# Patient Record
Sex: Male | Born: 1995
Health system: Southern US, Community
[De-identification: ages and names within clinical notes are randomized; demographics above are authoritative.]

## PROBLEM LIST (undated history)

## (undated) DIAGNOSIS — I313 Pericardial effusion (noninflammatory): Secondary | ICD-10-CM

## (undated) DIAGNOSIS — D649 Anemia, unspecified: Secondary | ICD-10-CM

## (undated) DIAGNOSIS — I3139 Other pericardial effusion (noninflammatory): Secondary | ICD-10-CM

## (undated) DIAGNOSIS — I309 Acute pericarditis, unspecified: Secondary | ICD-10-CM

---
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
## 2015-06-07 ENCOUNTER — Encounter: Payer: Self-pay | Admitting: Nurse Practitioner

## 2015-06-07 ENCOUNTER — Ambulatory Visit (INDEPENDENT_AMBULATORY_CARE_PROVIDER_SITE_OTHER): Payer: 59 | Admitting: Nurse Practitioner

## 2015-06-07 ENCOUNTER — Other Ambulatory Visit (HOSPITAL_COMMUNITY)
Admission: RE | Admit: 2015-06-07 | Discharge: 2015-06-07 | Disposition: A | Discharge status: Home / Self Care | Payer: 59 | Source: Ambulatory Visit | Attending: Nurse Practitioner | Admitting: Nurse Practitioner

## 2015-06-07 VITALS — BP 120/70 | Temp 98.7°F | Ht 69.0 in | Wt 189.0 lb

## 2015-06-07 DIAGNOSIS — R8290 Unspecified abnormal findings in urine: Secondary | ICD-10-CM | POA: Diagnosis not present

## 2015-06-07 DIAGNOSIS — J069 Acute upper respiratory infection, unspecified: Secondary | ICD-10-CM

## 2015-06-07 DIAGNOSIS — R3989 Other symptoms and signs involving the genitourinary system: Secondary | ICD-10-CM | POA: Diagnosis not present

## 2015-06-07 DIAGNOSIS — B9689 Other specified bacterial agents as the cause of diseases classified elsewhere: Secondary | ICD-10-CM

## 2015-06-07 LAB — HEPATIC FUNCTION PANEL
ALK PHOS: 67 U/L (ref 38–126)
ALT: 23 U/L (ref 17–63)
AST: 14 U/L — AB (ref 15–41)
Albumin: 4 g/dL (ref 3.5–5.0)
BILIRUBIN DIRECT: 0.2 mg/dL (ref 0.1–0.5)
BILIRUBIN INDIRECT: 0.7 mg/dL (ref 0.3–0.9)
TOTAL PROTEIN: 7.3 g/dL (ref 6.5–8.1)
Total Bilirubin: 0.9 mg/dL (ref 0.3–1.2)

## 2015-06-07 LAB — BASIC METABOLIC PANEL
Anion gap: 8 (ref 5–15)
BUN: 17 mg/dL (ref 6–20)
CHLORIDE: 100 mmol/L — AB (ref 101–111)
CO2: 27 mmol/L (ref 22–32)
CREATININE: 0.84 mg/dL (ref 0.61–1.24)
Calcium: 8.7 mg/dL — ABNORMAL LOW (ref 8.9–10.3)
Glucose, Bld: 115 mg/dL — ABNORMAL HIGH (ref 65–99)
POTASSIUM: 3.9 mmol/L (ref 3.5–5.1)
SODIUM: 135 mmol/L (ref 135–145)

## 2015-06-07 LAB — POCT URINALYSIS DIPSTICK
Spec Grav, UA: 1.02
pH, UA: 6

## 2015-06-07 LAB — CBC WITH DIFFERENTIAL/PLATELET
BASOS ABS: 0 10*3/uL (ref 0.0–0.1)
Basophils Relative: 0 %
EOS ABS: 0 10*3/uL (ref 0.0–0.7)
EOS PCT: 0 %
HCT: 36.5 % — ABNORMAL LOW (ref 39.0–52.0)
HEMOGLOBIN: 12.5 g/dL — AB (ref 13.0–17.0)
LYMPHS ABS: 1.4 10*3/uL (ref 0.7–4.0)
LYMPHS PCT: 13 %
MCH: 29.6 pg (ref 26.0–34.0)
MCHC: 34.2 g/dL (ref 30.0–36.0)
MCV: 86.5 fL (ref 78.0–100.0)
Monocytes Absolute: 1.3 10*3/uL — ABNORMAL HIGH (ref 0.1–1.0)
Monocytes Relative: 12 %
NEUTROS PCT: 75 %
Neutro Abs: 8.1 10*3/uL — ABNORMAL HIGH (ref 1.7–7.7)
PLATELETS: 213 10*3/uL (ref 150–400)
RBC: 4.22 MIL/uL (ref 4.22–5.81)
RDW: 12.6 % (ref 11.5–15.5)
WBC: 10.8 10*3/uL — AB (ref 4.0–10.5)

## 2015-06-07 MED ORDER — AZITHROMYCIN 250 MG PO TABS: ORAL_TABLET | ORAL | Status: DC

## 2015-06-07 MED FILL — AZITHROMYCIN 250 MG TABLET: 250 | 5 days supply | Qty: 6 | Fill #0

## 2015-06-07 NOTE — Patient Instructions (Signed)
Anti histamine nasacort AQ or Flonase 

## 2015-06-10 ENCOUNTER — Encounter: Payer: Self-pay | Admitting: Nurse Practitioner

## 2015-06-10 NOTE — Progress Notes (Signed)
Subjective:  Presents for complaints of chest tightness headache head congestion that began 3 days ago. Slight chest pain and tightness with deep breath. No shortness of breath, just difficulty taking a very deep breath. No actual wheezing. No fever. Sore throat has improved. Facial area headache. Minimal cough. No nausea vomiting. No diarrhea or abdominal pain. Does not use an inhaler. No dysuria urgency or frequency. Has noticed some slightly dark urine but states his fluid intake is good. Generalized muscle aches and mild fatigue.  Objective:   BP 120/70 mmHg  Temp(Src) 98.7 F (37.1 C) (Oral)  Ht 5\' 9"  (1.753 m)  Wt 189 lb (85.73 kg)  BMI 27.90 kg/m2 NAD. Alert, oriented. TMs clear effusion, no erythema. Pharynx injected with PND noted. Neck supple with mild soft anterior adenopathy. Lungs clear. No wheezing or tachypnea. Normal color. Normal airflow. Heart regular rate rhythm. Abdomen soft nondistended nontender without obvious hepatomegaly. Urine dipstick specific gravity 1.02 pH 6.0 and urobilinogen greater than 8.  Assessment: Bacterial upper respiratory infection - Plan: CANCELED: CBC with Differential/Platelet, CANCELED: Hepatic function panel, CANCELED: Basic metabolic panel  Abnormal urine color - Plan: POCT urinalysis dipstick, CBC with Differential/Platelet, Hepatic function panel, Basic metabolic panel  Abnormal urine findings - Plan: POCT urinalysis dipstick, CBC with Differential/Platelet, Hepatic function panel, Basic metabolic panel   Plan:  Meds ordered this encounter  Medications  . azithromycin (ZITHROMAX Z-PAK) 250 MG tablet    Sig: Take 2 tablets (500 mg) on  Day 1,  followed by 1 tablet (250 mg) once daily on Days 2 through 5.    Dispense:  6 each    Refill:  0    Order Specific Question:  Supervising Provider    Answer:  Riccardo DubinLUKING, WILLIAM S [[id number]]   Lab work based on urine dipstick and patient's complaints of dark urine. Physical exam of abdomen was normal. OTC  meds as directed for congestion and cough. Call back if worsens or persists.

## 2015-06-19 ENCOUNTER — Encounter: Payer: Self-pay | Admitting: Family Medicine

## 2015-06-19 ENCOUNTER — Encounter (HOSPITAL_COMMUNITY)
Admission: AD | Disposition: A | Discharge status: Home / Self Care | Payer: Self-pay | Source: Other Acute Inpatient Hospital | Attending: Internal Medicine

## 2015-06-19 ENCOUNTER — Inpatient Hospital Stay (HOSPITAL_COMMUNITY)
Admission: AD | Admit: 2015-06-19 | Discharge: 2015-06-21 | DRG: 316 | Disposition: A | Discharge status: Home / Self Care | Payer: 59 | Source: Other Acute Inpatient Hospital | Attending: Internal Medicine | Admitting: Internal Medicine

## 2015-06-19 ENCOUNTER — Encounter: Payer: Self-pay | Admitting: Internal Medicine

## 2015-06-19 ENCOUNTER — Ambulatory Visit (HOSPITAL_COMMUNITY)
Admission: RE | Admit: 2015-06-19 | Discharge: 2015-06-19 | Disposition: A | Discharge status: Home / Self Care | Payer: 59 | Source: Ambulatory Visit | Attending: Family Medicine | Admitting: Family Medicine

## 2015-06-19 ENCOUNTER — Ambulatory Visit (INDEPENDENT_AMBULATORY_CARE_PROVIDER_SITE_OTHER): Payer: 59 | Admitting: Internal Medicine

## 2015-06-19 ENCOUNTER — Ambulatory Visit (HOSPITAL_BASED_OUTPATIENT_CLINIC_OR_DEPARTMENT_OTHER)
Admission: RE | Admit: 2015-06-19 | Discharge: 2015-06-19 | Disposition: A | Discharge status: Home / Self Care | Payer: 59 | Source: Ambulatory Visit | Attending: Internal Medicine | Admitting: Internal Medicine

## 2015-06-19 ENCOUNTER — Ambulatory Visit (INDEPENDENT_AMBULATORY_CARE_PROVIDER_SITE_OTHER): Payer: 59 | Admitting: Family Medicine

## 2015-06-19 ENCOUNTER — Encounter (HOSPITAL_COMMUNITY): Payer: Self-pay | Admitting: Cardiology

## 2015-06-19 VITALS — BP 112/74 | Temp 100.2°F | Ht 70.0 in | Wt 183.0 lb

## 2015-06-19 VITALS — BP 118/62 | HR 134 | Ht 70.0 in | Wt 182.0 lb

## 2015-06-19 DIAGNOSIS — I3139 Other pericardial effusion (noninflammatory): Secondary | ICD-10-CM | POA: Diagnosis present

## 2015-06-19 DIAGNOSIS — D649 Anemia, unspecified: Secondary | ICD-10-CM | POA: Diagnosis not present

## 2015-06-19 DIAGNOSIS — R059 Cough, unspecified: Secondary | ICD-10-CM

## 2015-06-19 DIAGNOSIS — I309 Acute pericarditis, unspecified: Principal | ICD-10-CM | POA: Diagnosis present

## 2015-06-19 DIAGNOSIS — I517 Cardiomegaly: Secondary | ICD-10-CM

## 2015-06-19 DIAGNOSIS — B349 Viral infection, unspecified: Secondary | ICD-10-CM | POA: Diagnosis not present

## 2015-06-19 DIAGNOSIS — R05 Cough: Secondary | ICD-10-CM

## 2015-06-19 DIAGNOSIS — I319 Disease of pericardium, unspecified: Secondary | ICD-10-CM

## 2015-06-19 DIAGNOSIS — I509 Heart failure, unspecified: Secondary | ICD-10-CM | POA: Diagnosis not present

## 2015-06-19 DIAGNOSIS — R509 Fever, unspecified: Secondary | ICD-10-CM | POA: Diagnosis not present

## 2015-06-19 DIAGNOSIS — R0602 Shortness of breath: Secondary | ICD-10-CM | POA: Diagnosis not present

## 2015-06-19 DIAGNOSIS — R000 Tachycardia, unspecified: Secondary | ICD-10-CM | POA: Diagnosis present

## 2015-06-19 DIAGNOSIS — E876 Hypokalemia: Secondary | ICD-10-CM

## 2015-06-19 DIAGNOSIS — I313 Pericardial effusion (noninflammatory): Secondary | ICD-10-CM | POA: Diagnosis not present

## 2015-06-19 HISTORY — DX: Anemia, unspecified: D64.9

## 2015-06-19 HISTORY — DX: Acute pericarditis, unspecified: I30.9

## 2015-06-19 HISTORY — PX: CARDIAC CATHETERIZATION: SHX172

## 2015-06-19 HISTORY — DX: Pericardial effusion (noninflammatory): I31.3

## 2015-06-19 HISTORY — DX: Other pericardial effusion (noninflammatory): I31.39

## 2015-06-19 LAB — CBC WITH DIFFERENTIAL/PLATELET
BASOS PCT: 0 %
Basophils Absolute: 0 10*3/uL (ref 0.0–0.1)
EOS PCT: 0 %
Eosinophils Absolute: 0 10*3/uL (ref 0.0–0.7)
HEMATOCRIT: 32 % — AB (ref 39.0–52.0)
Hemoglobin: 10.4 g/dL — ABNORMAL LOW (ref 13.0–17.0)
LYMPHS PCT: 10 %
Lymphs Abs: 1.8 10*3/uL (ref 0.7–4.0)
MCH: 28.1 pg (ref 26.0–34.0)
MCHC: 32.5 g/dL (ref 30.0–36.0)
MCV: 86.5 fL (ref 78.0–100.0)
MONO ABS: 2.1 10*3/uL — AB (ref 0.1–1.0)
MONOS PCT: 12 %
NEUTROS ABS: 14 10*3/uL — AB (ref 1.7–7.7)
Neutrophils Relative %: 78 %
PLATELETS: 321 10*3/uL (ref 150–400)
RBC: 3.7 MIL/uL — ABNORMAL LOW (ref 4.22–5.81)
RDW: 12.6 % (ref 11.5–15.5)
WBC: 17.9 10*3/uL — ABNORMAL HIGH (ref 4.0–10.5)

## 2015-06-19 LAB — COMPREHENSIVE METABOLIC PANEL
ALT: 23 U/L (ref 17–63)
ANION GAP: 13 (ref 5–15)
AST: 13 U/L — ABNORMAL LOW (ref 15–41)
Albumin: 2.9 g/dL — ABNORMAL LOW (ref 3.5–5.0)
Alkaline Phosphatase: 77 U/L (ref 38–126)
BILIRUBIN TOTAL: 2.2 mg/dL — AB (ref 0.3–1.2)
BUN: 15 mg/dL (ref 6–20)
CHLORIDE: 98 mmol/L — AB (ref 101–111)
CO2: 23 mmol/L (ref 22–32)
Calcium: 8.3 mg/dL — ABNORMAL LOW (ref 8.9–10.3)
Creatinine, Ser: 1.02 mg/dL (ref 0.61–1.24)
GFR calc Af Amer: >60 mL/min (ref >60)
Glucose, Bld: 118 mg/dL — ABNORMAL HIGH (ref 65–99)
POTASSIUM: 3.9 mmol/L (ref 3.5–5.1)
Sodium: 134 mmol/L — ABNORMAL LOW (ref 135–145)
TOTAL PROTEIN: 6.3 g/dL — AB (ref 6.5–8.1)

## 2015-06-19 LAB — ECHOCARDIOGRAM COMPLETE
Height: 70 in
Weight: 2912 oz

## 2015-06-19 LAB — PROTIME-INR
INR: 1.61 — AB (ref 0.00–1.49)
PROTHROMBIN TIME: 19.2 s — AB (ref 11.6–15.2)

## 2015-06-19 LAB — TSH: TSH: 2.218 u[IU]/mL (ref 0.350–4.500)

## 2015-06-19 LAB — MRSA PCR SCREENING: MRSA BY PCR: NEGATIVE

## 2015-06-19 SURGERY — PERICARDIOCENTESIS

## 2015-06-19 MED ORDER — LIDOCAINE HCL (PF) 1 % IJ SOLN
INTRAMUSCULAR | Status: DC | PRN
  Administered 2015-06-19: 13 mL

## 2015-06-19 MED ORDER — FENTANYL CITRATE (PF) 100 MCG/2ML IJ SOLN
INTRAMUSCULAR | Status: AC
  Filled 2015-06-19: qty 2

## 2015-06-19 MED ORDER — ONDANSETRON HCL 4 MG/2ML IJ SOLN: 4.0000 mg | Freq: Four times a day (QID) | INTRAMUSCULAR | Status: DC | PRN

## 2015-06-19 MED ORDER — COLCHICINE 0.6 MG PO TABS
0.6000 mg | ORAL_TABLET | Freq: Every day | ORAL | Status: DC
  Administered 2015-06-20 – 2015-06-21 (×3): 0.6 mg via ORAL
  Filled 2015-06-19 (×3): qty 1

## 2015-06-19 MED ORDER — HEPARIN (PORCINE) IN NACL 2-0.9 UNIT/ML-% IJ SOLN
INTRAMUSCULAR | Status: AC
  Filled 2015-06-19: qty 500

## 2015-06-19 MED ORDER — MIDAZOLAM HCL 2 MG/2ML IJ SOLN
INTRAMUSCULAR | Status: AC
  Filled 2015-06-19: qty 2

## 2015-06-19 MED ORDER — FENTANYL CITRATE (PF) 100 MCG/2ML IJ SOLN
INTRAMUSCULAR | Status: DC | PRN
  Administered 2015-06-19: 50 ug via INTRAVENOUS

## 2015-06-19 MED ORDER — ACETAMINOPHEN 325 MG PO TABS
650.0000 mg | ORAL_TABLET | ORAL | Status: DC | PRN
  Administered 2015-06-20: 650 mg via ORAL
  Filled 2015-06-19: qty 2

## 2015-06-19 MED ORDER — ASPIRIN 300 MG RE SUPP: 300.0000 mg | RECTAL | Status: AC

## 2015-06-19 MED ORDER — LIDOCAINE HCL (PF) 1 % IJ SOLN
INTRAMUSCULAR | Status: AC
  Filled 2015-06-19: qty 30

## 2015-06-19 MED ORDER — ONDANSETRON HCL 8 MG PO TABS: 8.0000 mg | ORAL_TABLET | Freq: Three times a day (TID) | ORAL | Status: DC | PRN

## 2015-06-19 MED ORDER — CEFPROZIL 500 MG PO TABS: 500.0000 mg | ORAL_TABLET | Freq: Two times a day (BID) | ORAL | Status: DC

## 2015-06-19 MED ORDER — ASPIRIN 81 MG PO CHEW
324.0000 mg | CHEWABLE_TABLET | ORAL | Status: AC
  Administered 2015-06-20: 324 mg via ORAL
  Filled 2015-06-19: qty 4

## 2015-06-19 MED ORDER — IBUPROFEN 800 MG PO TABS
800.0000 mg | ORAL_TABLET | Freq: Three times a day (TID) | ORAL | Status: DC
  Administered 2015-06-20 – 2015-06-21 (×5): 800 mg via ORAL
  Filled 2015-06-19 (×2): qty 4
  Filled 2015-06-19 (×5): qty 1
  Filled 2015-06-19 (×3): qty 4
  Filled 2015-06-19 (×2): qty 1

## 2015-06-19 MED ORDER — HEPARIN (PORCINE) IN NACL 2-0.9 UNIT/ML-% IJ SOLN
INTRAMUSCULAR | Status: DC | PRN
  Administered 2015-06-19: 500 mL

## 2015-06-19 MED ORDER — MIDAZOLAM HCL 2 MG/2ML IJ SOLN
INTRAMUSCULAR | Status: DC | PRN
  Administered 2015-06-19: 2 mg via INTRAVENOUS

## 2015-06-19 SURGICAL SUPPLY — 4 items
PACK CARDIAC CATHETERIZATION (CUSTOM PROCEDURE TRAY) ×2 IMPLANT
PERIVAC PERICARDIOCENTESIS 8.3 (TRAY / TRAY PROCEDURE) ×2 IMPLANT
PROTECTION STATION PRESSURIZED (MISCELLANEOUS) ×3
STATION PROTECTION PRESSURIZED (MISCELLANEOUS) IMPLANT

## 2015-06-19 NOTE — Progress Notes (Addendum)
Called to see patient after arrival [facility]  Patient has a 3 week history of viral syndrome with fever, headache, N/V and abdominal pain and was treated with antibiotics and finished 8 days ago and got better and then developed RUQ pain with chest pressure on Thursday.  Sat and Sunday had N/V all day and chest tightness.  Seen by PCP and was febrile to 100.2 and was tachycardic at 112bpm.  Sent over to Cardiology.  When seen by Dr. Tenny Crawoss his HR was 130's.  Echo done which showed large pericardial effusion with 20-3925mmHg change in MV inflow velocity with respiration, dilated IVC at 2.6 cm with < 50% change in diameter with respirations.  He continues to have CP and SOB at rest.  HR now 115bpm with SBP mid 90's despite IVF bolus.  I have discussed the case with Dr. Swazil[place]Jordan who is on call for interventional Cardiology.  Recommend proceeding with pericardiocentesis tonight with pericardial drain.  Risks of procedure explained to the patient and family including risk of RV tear with need for emergent cardiac surgery, infection, bleeding and death.  Patient and mother understand risks and agree to proceed.  Will hold off on NSAIDs until after pericardial tap.  Start colchicine 0.6mg  BID.  I have spent 40 minutes with patient reviewing echo, labs, admission note, interviewing patient and discussing assessment and plan as well as risks and benefits of pericardiocentesis.  > 50% of time was spent in direct patient care.

## 2015-06-19 NOTE — Patient Instructions (Signed)
Your physician has requested that you have an echocardiogram NOW. Echocardiography is a painless test that uses sound waves to create images of your heart. It provides your doctor with information about the size and shape of your heart and how well your heart's chambers and valves are working. This procedure takes approximately one hour. There are no restrictions for this procedure.  Dr Tenny Crawoss to discuss with parent discharge instructions

## 2015-06-19 NOTE — Progress Notes (Signed)
   Cardiology Office Note   Date:  06/19/2015   ID:  Johnathan Dresseryler L Bristow, DOB 1995/11/20, MRN [id number]  PCP:  Lilyan PuntScott Luking, MD  Cardiologist:   Dietrich PatesPaula Nikia Levels, MD   No chief complaint on file. Pt [facility] from [facility] for SOB, tachycardia   History of Present Illness: Johnathan Ford is a 20 y.o. male with no prior cardiac history   Pt had an uppper respiratory inffection a few wks ago   (fever, HA, abdominal pain, N/V) 3 wks ago  Rx ABx  Finished about 8 days ago   Felt better for a few days then by Thursday developed RUQ discomfort, chest pressure  Sat and Sunday have N/V through day  Still with chest tightness  Has nt been comfortable in bed  Feverish.   Seen by Dr Gerda DissLuking this AM  T 100.2  BP 112/  Pulse elevated   Sinus prssure  Got better after ABX done but thhen started having SOB, low greade fever, aches  No cough Inte Pt does do cross fit.  Takes supplements (diet   No steroids         Outpatient Prescriptions Prior to Visit  Medication Sig Dispense Refill  . cefPROZIL (CEFZIL) 500 MG tablet Take 1 tablet (500 mg total) by mouth 2 (two) times daily. 20 tablet 0  . ondansetron (ZOFRAN) 8 MG tablet Take 1 tablet (8 mg total) by mouth every 8 (eight) hours as needed for nausea. 20 tablet 0   No facility-administered medications prior to visit.     Allergies:   Review of patient's allergies indicates no known allergies.   No past medical history on file.  No past surgical history on file.   Social History:  The patient  reports that he has never smoked. He does not have any smokeless tobacco history on file. He reports that he does not drink alcohol or use illicit drugs.   Family History:  The patient's family history is not on file.    ROS:  Please see the history of present illness. All other systems are reviewed and  Negative to the above problem except as noted.    PHYSICAL EXAM: VS:  There were no vitals taken for this visit.  GEN: Well nourished, well  developed, in no acute distress HEENT: normal Neck: no JVD, carotid bruits, or masses Cardiac: RRR; no murmurs, rubs, or gallops,no edema  Respiratory:  clear to auscultation bilaterally, normal work of breathing GI: soft, nontender, nondistended, + BS  No hepatomegaly  MS: no deformity Moving all extremities   Skin: warm and dry, no rash Neuro:  Strength and sensation are intact Psych: euthymic mood, full affect   EKG:  EKG is ordered today.   Lipid Panel No results found for: CHOL, TRIG, HDL, CHOLHDL, VLDL, LDLCALC, LDLDIRECT    Wt Readings from Last 3 Encounters:  06/19/15 183 lb (83.008 kg) (84 %*, Z = 0.98)  06/07/15 189 lb (85.73 kg) (88 %*, Z = 1.15)  03/17/13 163 lb 2 oz (73.993 kg) (76 %*, Z = 0.70)   * Growth percentiles are based on CDC 2-20 Years data.      ASSESSMENT AND PLAN:   See H and P in note section  Pt admitted to CCU  Signed, Dietrich PatesPaula Lacy Taglieri, MD  06/19/2015 3:19 PM    [facility] [address], [place], [place]  [address] Phone: [phone]; Fax: [phone]

## 2015-06-19 NOTE — H&P (View-Only) (Signed)
Called to see patient after arrival in [facility] as transfer from [facility] office.  Patient has a 3 week history of viral syndrome with fever, headache, N/V and abdominal pain and was treated with antibiotics and finished 8 days ago and got better and then developed RUQ pain with chest pressure on Thursday.  Sat and Sunday had N/V all day and chest tightness.  Seen by PCP and was febrile to 100.2 and was tachycardic at 112bpm.  Sent over to Cardiology.  When seen by Dr. Ross his HR was 130's.  Echo done which showed large pericardial effusion with 20-25mmHg change in MV inflow velocity with respiration, dilated IVC at 2.6 cm with < 50% change in diameter with respirations.  He continues to have CP and SOB at rest.  HR now 115bpm with SBP mid 90's despite IVF bolus.  I have discussed the case with Dr. Jordan who is on call for interventional Cardiology.  Recommend proceeding with pericardiocentesis tonight with pericardial drain.  Risks of procedure explained to the patient and family including risk of RV tear with need for emergent cardiac surgery, infection, bleeding and death.  Patient and mother understand risks and agree to proceed.  Will hold off on NSAIDs until after pericardial tap.  Start colchicine 0.6mg BID.  I have spent 40 minutes with patient reviewing echo, labs, admission note, interviewing patient and discussing assessment and plan as well as risks and benefits of pericardiocentesis.  > 50% of time was spent in direct patient care. 

## 2015-06-19 NOTE — H&P (Addendum)
Primary Physician: Lilyan Punt  Primary Cardiologist:  Haynes Kerns  Pt presents for SOB and CP    HPI: Johnathan Ford is a 20 y.o. male with no prior cardiac history  Pt had an uppper respiratory inffection a few wks ago (fever, HA, abdominal pain, N/V) 3 wks ago Rx ABx Finished about 8 days ago Felt better for a few days then by Thursday developed RUQ discomfort, chest pressure Sat and Sunday have N/V through day Still with chest tightness Has nt been comfortable in bed Feverish.  Seen by Dr Gerda Diss this AM T 100.2 BP 112/ Pulse elevated  Sinus prssure Got better after ABX done but thhen started having SOB, low greade fever, aches No cough In clinic T 102  HR 120  BP 100   Pt does do cross fit. Takes supplements (diet protein,  No steroids) Was well until a few wks ago           No past medical history on file.  No prescriptions prior to admission       Infusions:   No Known Allergies  Social History   Social History  . Marital Status: Single    Spouse Name: N/A  . Number of Children: N/A  . Years of Education: N/A   Occupational History  . Not on file.   Social History Main Topics  . Smoking status: Never Smoker   . Smokeless tobacco: Not on file  . Alcohol Use: No  . Drug Use: No  . Sexual Activity: No   Other Topics Concern  . Not on file   Social History Narrative    No family history on file.  No family history of premature CAD or CHF    REVIEW OF SYSTEMS:  All systems reviewed  Negative to the above problem except as noted above.    PHYSICAL EXAM: There were no vitals filed for this visit.  No intake or output data in the 24 hours ending 06/19/15 1739  General:  Well appearing. No respiratory difficulty HEENT: normal Neck: supple. no JVD. Carotids 2+ bilat; no bruits. No lymphadenopathy or thryomegaly appreciated. Cor: PMI nondisplaced. Regular rate & rhythm. Tachy  No rubs, gallops or murmurs. Lungs:  clear Abdomen: soft, LIver edge is 2 cm below rib linie  Tender   No bruits or masses. Good bowel sounds. Extremities: no cyanosis, clubbing, rash, edema Neuro: alert & oriented x 3, cranial nerves grossly intact. moves all 4 extremities w/o difficulty. Affect pleasant.  ECG:  From Dr Cathlyn Parsons office  ST 139 bpm  T wave inversion infeirorlaterally  No results found for this or any previous visit (from the past 24 hour(s)). Dg Chest 2 View  06/19/2015  CLINICAL DATA:  Cough, fever EXAM: CHEST  2 VIEW COMPARISON:  04/18/2006 FINDINGS: There is significant cardiomegaly new from prior exam. There is trace left pleural effusion or scarring in left costophrenic angle. Mild left basilar atelectasis. No infiltrate or pulmonary edema. Bony thorax is unremarkable. IMPRESSION: Significant cardiomegaly new from prior exam. Trace left pleural effusion or scarring in left costophrenic angle. Mild left basilar atelectasis. No segmental infiltrate or pulmonary edema. Electronically Signed   By: Natasha Mead M.D.   On: 06/19/2015 12:27     ASSESSMENT:  Pt is a 20 yo with no prior cardiac history  3 wk hx consistent with URI  Viral infection Presetns today with continued SOB and chest discomfort.  Tachycardic on exam  CXR with cardiomegaly  Echo  shows large pericardial effusion around heart  There is RV indentation in diastole  There is some mitral inflow variation but does not appear greater than 25%; IVC is dilated with blunted inspir collapse  LV is normal in size  Difficult to accurately assess LVEF given tachycardia  May be mildly depressed  On exam   T 102  P 120   BP 100/  There were no pulsus paradoxus  PT with probable viral pericarditis +- myocarditis. Hemodynamics are tenuous now.   Given 500 cc IV in clinic   Urine concentrated  Plan to tx to [facility] with admission to CCU  Dr harding notified of transfer.  Will need pericardial drainage    :

## 2015-06-19 NOTE — Addendum Note (Signed)
Addended by: Metro KungICHARDS, Dameir Gentzler M on: 06/19/2015 02:28 PM   Modules accepted: Orders

## 2015-06-19 NOTE — Interval H&P Note (Signed)
History and Physical Interval Note:  06/19/2015 10:54 PM  Johnathan Ford  has presented today for surgery, with the diagnosis of pericardial tap  The various methods of treatment have been discussed with the patient and family. After consideration of risks, benefits and other options for treatment, the patient has consented to  Procedure(s): Pericardiocentesis (N/A) as a surgical intervention .  The patient's history has been reviewed, patient examined, no change in status, stable for surgery.  I have reviewed the patient's chart and labs.  Questions were answered to the patient's satisfaction.     Myka Hitz [place]az[place]Jordan MD,FACC 06/19/2015 10:54 PM

## 2015-06-19 NOTE — Progress Notes (Addendum)
   Subjective:    Patient ID: Johnathan Ford, male    DOB: 06-25-1995, 20 y.o.   MRN: [id number]  Sinusitis This is a new problem. Episode onset: 3 weeks ago. (Abd pain, fever, headache, runny nose, cough, vomiting) Treatments tried: antibotics.   Patient states he started off couple weeks go ahead congestion drainage coughing not feeling good but then it progressed into having some sinus pressure is well in addition to this he describes intermittent abdominal pain and discomfort in the upper right quadrant into the lower part of his chest this Johnathan Ford better after he took antibiotics but then it started coming back over the weekend he started noticing low-grade fever fatigue body aches with it as well denies coughing up any discolored phlegm denies any lack of appetite but does relate some intermittent nausea vomiting. Patient states that he felt intermittent tightness in the chest. Also right upper quadrant discomfort. Patient states he had similar symptoms approximately 3 weeks ago got better after the antibiotics but then over this weekend got dramatically worse. No prior troubles. Patient does do cross fit-she does take some dietary supplements but denies using any type of steroids or any type of tablets he just basically does protein powders.  Review of Systems    see above. Low-grade fevers this weekend. Objective:   Physical Exam  On examination lungs are clear but diminished breath sounds on the right side he is having tenderness in the right upper quadrant ears are normal throat is normal neck no masses. Should be noted that patient is tachycardic. Extremities no edema. We need to do the chest x-ray to rule out the possibility of pneumonia. Hold off on lab work and IV medicines currently    Assessment & Plan:  Viral syndrome versus secondary bacterial infection Cefzil twice a day also stat chest x-ray lab work was completed on last visit do not feel further lab work necessary. If if  chest x-ray is nonspecific may have to do an ultrasound depending on how well the patient improves over the next couple days  Please note chest x-ray showed significant cardiomegaly. This was noted on radiology report and also viewed by myself with agreement. The patient was called and was asked to come back in and the EKG was made which shows sinus tachycardia with a T-wave abnormality.  This is concerning for viral myocarditis. I spoke with Dr. Dietrich PatesPaula Ford cardiology. They could see him right away and would be conducting an echo. More than likely will need further workup possible admission. The patient's mother was present with him and she was taking him directly to cardiology at the hospital.

## 2015-06-20 ENCOUNTER — Encounter (HOSPITAL_COMMUNITY): Payer: Self-pay | Admitting: Cardiology

## 2015-06-20 ENCOUNTER — Encounter (HOSPITAL_COMMUNITY)
Admission: AD | Disposition: A | Discharge status: Home / Self Care | Payer: Self-pay | Source: Other Acute Inpatient Hospital | Attending: Internal Medicine

## 2015-06-20 ENCOUNTER — Inpatient Hospital Stay (HOSPITAL_COMMUNITY): Payer: 59

## 2015-06-20 ENCOUNTER — Ambulatory Visit (HOSPITAL_COMMUNITY): Admission: RE | Admit: 2015-06-20 | Payer: 59 | Source: Ambulatory Visit | Admitting: Interventional Cardiology

## 2015-06-20 DIAGNOSIS — I319 Disease of pericardium, unspecified: Secondary | ICD-10-CM

## 2015-06-20 LAB — CBC
HEMATOCRIT: 30.1 % — AB (ref 39.0–52.0)
HEMOGLOBIN: 9.9 g/dL — AB (ref 13.0–17.0)
MCH: 28.4 pg (ref 26.0–34.0)
MCHC: 32.9 g/dL (ref 30.0–36.0)
MCV: 86.2 fL (ref 78.0–100.0)
Platelets: 307 10*3/uL (ref 150–400)
RBC: 3.49 MIL/uL — ABNORMAL LOW (ref 4.22–5.81)
RDW: 12.7 % (ref 11.5–15.5)
WBC: 12.5 10*3/uL — ABNORMAL HIGH (ref 4.0–10.5)

## 2015-06-20 LAB — BODY FLUID CELL COUNT WITH DIFFERENTIAL
Eos, Fluid: 0 %
Lymphs, Fluid: 59 %
MONOCYTE-MACROPHAGE-SEROUS FLUID: 11 % — AB (ref 50–90)
Neutrophil Count, Fluid: 30 % — ABNORMAL HIGH (ref 0–25)
Total Nucleated Cell Count, Fluid: 380 cu mm (ref 0–1000)

## 2015-06-20 LAB — ECHOCARDIOGRAM LIMITED
HEIGHTINCHES: 70 in
Weight: 2910.07 oz

## 2015-06-20 LAB — BASIC METABOLIC PANEL
ANION GAP: 11 (ref 5–15)
BUN: 15 mg/dL (ref 6–20)
CALCIUM: 8 mg/dL — AB (ref 8.9–10.3)
CO2: 25 mmol/L (ref 22–32)
Chloride: 98 mmol/L — ABNORMAL LOW (ref 101–111)
Creatinine, Ser: 0.92 mg/dL (ref 0.61–1.24)
GLUCOSE: 116 mg/dL — AB (ref 65–99)
POTASSIUM: 3 mmol/L — AB (ref 3.5–5.1)
Sodium: 134 mmol/L — ABNORMAL LOW (ref 135–145)

## 2015-06-20 LAB — PROTEIN, PERICARDIAL FLUID: Protein, Pericardial Fluid: 6 g/dL

## 2015-06-20 LAB — CREATININE, FLUID (PLEURAL, PERITONEAL, JP DRAINAGE): CREAT FL: 0.9 mg/dL

## 2015-06-20 LAB — LACTATE DEHYDROGENASE, PLEURAL OR PERITONEAL FLUID: LD FL: 424 U/L — AB (ref 3–23)

## 2015-06-20 LAB — GRAM STAIN

## 2015-06-20 SURGERY — PERICARDIOCENTESIS

## 2015-06-20 MED ORDER — MUPIROCIN 2 % EX OINT
TOPICAL_OINTMENT | CUTANEOUS | Status: AC
  Filled 2015-06-20: qty 22

## 2015-06-20 MED ORDER — IOPAMIDOL (ISOVUE-300) INJECTION 61%
INTRAVENOUS | Status: AC
  Administered 2015-06-20: 75 mL
  Filled 2015-06-20: qty 75

## 2015-06-20 MED ORDER — POTASSIUM CHLORIDE CRYS ER 20 MEQ PO TBCR
40.0000 meq | EXTENDED_RELEASE_TABLET | Freq: Once | ORAL | Status: AC
  Administered 2015-06-20: 40 meq via ORAL
  Filled 2015-06-20: qty 2

## 2015-06-20 NOTE — Progress Notes (Addendum)
Subjective: Breathing is good  No CP   Objective: Filed Vitals:   06/20/15 0400 06/20/15 0430 06/20/15 0500 06/20/15 0600  BP: 89/47 92/45 101/39 104/52  Pulse: 81 76 76 84  Temp:  97.9 F (36.6 C)    TempSrc:  Oral    Resp: Height:      Weight:      SpO2: 97% 97% 100% 97%   Weight change:   Intake/Output Summary (Last 24 hours) at 06/20/15 0754 Last data filed at 06/20/15 0400  Gross per 24 hour  Intake    980 ml  Output   1050 ml  Net    -70 ml    General: Alert, awake, oriented x3, in no acute distress Neck:  JVP is normal No lyympadenopathy  Heart: Regular rate and rhythm, without murmurs, rubs, gallops.  Lungs: Clear to auscultation.  No rales or wheezes. Abdomen  Supple  No masses  nontender  No hepatosplenomegaly  Exemities:  No edema.   Neuro: Grossly intact, nonfocal.  Tele  SR  90s  Lab Results: Results for orders placed or performed during the hospital encounter of 06/19/15 (from the past 24 hour(s))  MRSA PCR Screening     Status: None   Collection Time: 06/19/15  6:44 PM  Result Value Ref Range   MRSA by PCR NEGATIVE NEGATIVE  TSH     Status: None   Collection Time: 06/19/15  7:37 PM  Result Value Ref Range   TSH 2.218 0.350 - 4.500 uIU/mL  CBC WITH DIFFERENTIAL     Status: Abnormal   Collection Time: 06/19/15  8:15 PM  Result Value Ref Range   WBC 17.9 (H) 4.0 - 10.5 K/uL   RBC 3.70 (L) 4.22 - 5.81 MIL/uL   Hemoglobin 10.4 (L) 13.0 - 17.0 g/dL   HCT 16.1 (L) 09.6 - 04.5 %   MCV 86.5 78.0 - 100.0 fL   MCH 28.1 26.0 - 34.0 pg   MCHC 32.5 30.0 - 36.0 g/dL   RDW 40.9 81.1 - 91.4 %   Platelets 321 150 - 400 K/uL   Neutrophils Relative % 78 %   Neutro Abs 14.0 (H) 1.7 - 7.7 K/uL   Lymphocytes Relative 10 %   Lymphs Abs 1.8 0.7 - 4.0 K/uL   Monocytes Relative 12 %   Monocytes Absolute 2.1 (H) 0.1 - 1.0 K/uL   Eosinophils Relative 0 %   Eosinophils Absolute 0.0 0.0 - 0.7 K/uL   Basophils Relative 0 %   Basophils Absolute 0.0 0.0  - 0.1 K/uL  Comprehensive metabolic panel     Status: Abnormal   Collection Time: 06/19/15  8:15 PM  Result Value Ref Range   Sodium 134 (L) 135 - 145 mmol/L   Potassium 3.9 3.5 - 5.1 mmol/L   Chloride 98 (L) 101 - 111 mmol/L   CO2 23 22 - 32 mmol/L   Glucose, Bld 118 (H) 65 - 99 mg/dL   BUN 15 6 - 20 mg/dL   Creatinine, Ser 7.82 0.61 - 1.24 mg/dL   Calcium 8.3 (L) 8.9 - 10.3 mg/dL   Total Protein 6.3 (L) 6.5 - 8.1 g/dL   Albumin 2.9 (L) 3.5 - 5.0 g/dL   AST 13 (L) 15 - 41 U/L   ALT 23 17 - 63 U/L   Alkaline Phosphatase 77 38 - 126 U/L   Total Bilirubin 2.2 (H) 0.3 - 1.2 mg/dL   GFR calc non Af Amer >60 >60  mL/min   GFR calc Af Amer >60 >60 mL/min   Anion gap 13 5 - 15  Protime-INR     Status: Abnormal   Collection Time: 06/19/15  8:15 PM  Result Value Ref Range   Prothrombin Time 19.2 (H) 11.6 - 15.2 seconds   INR 1.61 (H) 0.00 - 1.49  Lactate dehydrogenase (CSF, pleural or peritoneal fluid)     Status: Abnormal   Collection Time: 06/19/15 11:11 PM  Result Value Ref Range   LD, Fluid 424 (H) 3 - 23 U/L   Fluid Type-FLDH PERICARDIAL SAC   Culture, body fluid-bottle     Status: None (Preliminary result)   Collection Time: 06/19/15 11:12 PM  Result Value Ref Range   Specimen Description FLUID PERICARDIAL    Special Requests BOTTLES DRAWN AEROBIC AND ANAEROBIC 10CC    Culture PENDING    Report Status PENDING   Gram stain     Status: None   Collection Time: 06/19/15 11:12 PM  Result Value Ref Range   Specimen Description FLUID PERICARDIAL    Special Requests NONE    Gram Stain      RARE WBC PRESENT, PREDOMINANTLY MONONUCLEAR NO ORGANISMS SEEN    Report Status 06/20/2015 FINAL   Basic metabolic panel     Status: Abnormal   Collection Time: 06/20/15  3:04 AM  Result Value Ref Range   Sodium 134 (L) 135 - 145 mmol/L   Potassium 3.0 (L) 3.5 - 5.1 mmol/L   Chloride 98 (L) 101 - 111 mmol/L   CO2 25 22 - 32 mmol/L   Glucose, Bld 116 (H) 65 - 99 mg/dL   BUN 15 6 - 20 mg/dL    Creatinine, Ser 2.950.92 0.61 - 1.24 mg/dL   Calcium 8.0 (L) 8.9 - 10.3 mg/dL   GFR calc non Af Amer >60 >60 mL/min   GFR calc Af Amer >60 >60 mL/min   Anion gap 11 5 - 15  CBC     Status: Abnormal   Collection Time: 06/20/15  3:04 AM  Result Value Ref Range   WBC 12.5 (H) 4.0 - 10.5 K/uL   RBC 3.49 (L) 4.22 - 5.81 MIL/uL   Hemoglobin 9.9 (L) 13.0 - 17.0 g/dL   HCT 62.130.1 (L) 30.839.0 - 65.752.0 %   MCV 86.2 78.0 - 100.0 fL   MCH 28.4 26.0 - 34.0 pg   MCHC 32.9 30.0 - 36.0 g/dL   RDW 84.612.7 96.211.5 - 95.215.5 %   Platelets 307 150 - 400 K/uL    Studies/Results: Dg Chest 2 View  06/19/2015  CLINICAL DATA:  Cough, fever EXAM: CHEST  2 VIEW COMPARISON:  04/18/2006 FINDINGS: There is significant cardiomegaly new from prior exam. There is trace left pleural effusion or scarring in left costophrenic angle. Mild left basilar atelectasis. No infiltrate or pulmonary edema. Bony thorax is unremarkable. IMPRESSION: Significant cardiomegaly new from prior exam. Trace left pleural effusion or scarring in left costophrenic angle. Mild left basilar atelectasis. No segmental infiltrate or pulmonary edema. Electronically Signed   By: Natasha MeadLiviu  Pop M.D.   On: 06/19/2015 12:27    Medications:REviewed  @PROBHOSP @  1  Pericardial effusion  Pt is s/p pericardiocentesis last night  700 cc of serosanguinous fluid with WBCs  (lymphocytes, neutrophils)  Cytology pending   Drain with minimal output   Pt is anemic with continued low grade fevers  (? B symptoms)  ? Malignant   ? Infectious/post infectious  REcomm CT of chest   Await cytology  Would recomm limited echo to reeval effusion and also pumping function  Diffcult to assess LVEF as he was very tachyardic   2  Hypokalemia  K given  WIll follow     LOS: 1 day   Dietrich Pates 06/20/2015, 7:54 AM

## 2015-06-20 NOTE — Progress Notes (Signed)
Echocardiogram 2D Echocardiogram has been performed.  Dorothey BasemanReel, Hanah Moultry M 06/20/2015, 11:33 AM

## 2015-06-20 NOTE — Care Management Note (Signed)
Case Management Note  Patient Details  Name: Johnathan Ford MRN: [id number] Date of Birth: 25-Feb-1996  Subjective/Objective:        Adm w pericardial effusion            Action/Plan:lives at home, pcp d luking   Expected Discharge Date:                  Expected Discharge Plan:  Home/Self Care  In-House Referral:     Discharge planning Services     Post Acute Care Choice:    Choice offered to:     DME Arranged:    DME Agency:     HH Arranged:    HH Agency:     Status of Service:     Medicare Important Message Given:    Date Medicare IM Given:    Medicare IM give by:    Date Additional Medicare IM Given:    Additional Medicare Important Message give by:     If discussed at Long Length of Stay Meetings, dates discussed:    Additional Comments: ur review done  Hanley HaysDowell, Kimyatta Lecy T, RN 06/20/2015, 7:46 AM

## 2015-06-20 NOTE — Progress Notes (Signed)
Cardiology MD paged in regards to patient's blood pressure systolic in 80-90's. MD would like to continue monitoring patient for now.  Milon DikesKristina D Nia Nathaniel, RN

## 2015-06-20 NOTE — Progress Notes (Signed)
Notified Cardiology MD of critical K+ this AM. Will continue to monitor. Milon DikesKristina D Jalina Blowers, RN

## 2015-06-21 ENCOUNTER — Encounter (HOSPITAL_COMMUNITY): Payer: Self-pay | Admitting: Nurse Practitioner

## 2015-06-21 ENCOUNTER — Other Ambulatory Visit: Payer: Self-pay | Admitting: Nurse Practitioner

## 2015-06-21 ENCOUNTER — Inpatient Hospital Stay (HOSPITAL_COMMUNITY): Payer: 59

## 2015-06-21 DIAGNOSIS — D649 Anemia, unspecified: Secondary | ICD-10-CM

## 2015-06-21 DIAGNOSIS — I313 Pericardial effusion (noninflammatory): Secondary | ICD-10-CM

## 2015-06-21 DIAGNOSIS — I3139 Other pericardial effusion (noninflammatory): Secondary | ICD-10-CM

## 2015-06-21 DIAGNOSIS — I319 Disease of pericardium, unspecified: Secondary | ICD-10-CM

## 2015-06-21 DIAGNOSIS — E876 Hypokalemia: Secondary | ICD-10-CM

## 2015-06-21 LAB — BASIC METABOLIC PANEL
ANION GAP: 10 (ref 5–15)
BUN: 14 mg/dL (ref 6–20)
CHLORIDE: 102 mmol/L (ref 101–111)
CO2: 24 mmol/L (ref 22–32)
Calcium: 8.3 mg/dL — ABNORMAL LOW (ref 8.9–10.3)
Creatinine, Ser: 0.87 mg/dL (ref 0.61–1.24)
Glucose, Bld: 87 mg/dL (ref 65–99)
POTASSIUM: 3.8 mmol/L (ref 3.5–5.1)
SODIUM: 136 mmol/L (ref 135–145)

## 2015-06-21 LAB — CBC
HCT: 31.1 % — ABNORMAL LOW (ref 39.0–52.0)
Hemoglobin: 10 g/dL — ABNORMAL LOW (ref 13.0–17.0)
MCH: 27.9 pg (ref 26.0–34.0)
MCHC: 32.2 g/dL (ref 30.0–36.0)
MCV: 86.9 fL (ref 78.0–100.0)
Platelets: 361 10*3/uL (ref 150–400)
RBC: 3.58 MIL/uL — ABNORMAL LOW (ref 4.22–5.81)
RDW: 12.8 % (ref 11.5–15.5)
WBC: 11.2 10*3/uL — ABNORMAL HIGH (ref 4.0–10.5)

## 2015-06-21 LAB — ECHOCARDIOGRAM LIMITED
HEIGHTINCHES: 70 in
WEIGHTICAEL: 2910.07 [oz_av]

## 2015-06-21 LAB — PH, BODY FLUID: pH, Body Fluid: 7.5

## 2015-06-21 MED ORDER — PANTOPRAZOLE SODIUM 40 MG PO TBEC
40.0000 mg | DELAYED_RELEASE_TABLET | Freq: Every day | ORAL | Status: DC
  Administered 2015-06-21: 40 mg via ORAL
  Filled 2015-06-21: qty 1

## 2015-06-21 MED ORDER — COLCHICINE 0.6 MG PO TABS: 0.6000 mg | ORAL_TABLET | Freq: Every day | ORAL | Status: DC

## 2015-06-21 MED ORDER — IBUPROFEN 800 MG PO TABS: 800.0000 mg | ORAL_TABLET | Freq: Three times a day (TID) | ORAL | Status: DC

## 2015-06-21 MED ORDER — PANTOPRAZOLE SODIUM 40 MG PO TBEC: 40.0000 mg | DELAYED_RELEASE_TABLET | Freq: Every day | ORAL | Status: DC

## 2015-06-21 MED FILL — COLCHICINE 0.6 MG TABLET: 0.6 | 90 days supply | Qty: 90 | Fill #0

## 2015-06-21 MED FILL — PANTOPRAZOLE SOD DR 40 MG T: 40 | 30 days supply | Qty: 30 | Fill #0

## 2015-06-21 NOTE — Progress Notes (Signed)
Patient Name: Johnathan Ford Date of Encounter: 06/21/2015  Hospital Problem List     Principal Problem:   Pericardial effusion Active Problems:   Acute pericarditis   Viral syndrome   Normocytic anemia   Hypokalemia   Subjective   Still with some pleuritic chest pain though overall improved.  Pericardial drain d/c'd yesterday.  Limited echo while drain in showed reduced size of effusion.  CT Chest w/o nodules.  Pathology suggestive of reactive process.  Inpatient Medications    . colchicine  0.6 mg Oral Daily  . ibuprofen  800 mg Oral TID    Vital Signs    Filed Vitals:   06/21/15 0300 06/21/15 0400 06/21/15 0521 06/21/15 0600  BP: 102/52 103/55 95/50 99/58   Pulse: 85 75 78 80  Temp:  99.2 F (37.3 C)    TempSrc:  Oral    Resp: 22 18 20 24   Height:      Weight:      SpO2: 94% 97% 95% 96%    Intake/Output Summary (Last 24 hours) at 06/21/15 0850 Last data filed at 06/20/15 1800  Gross per 24 hour  Intake   1260 ml  Output     10 ml  Net   1250 ml   Filed Weights   06/20/15 0000  Weight: 181 lb 14.1 oz (82.5 kg)    Physical Exam    General: Pleasant, NAD. Neuro: Alert and oriented X 3. Moves all extremities spontaneously. Psych: Normal affect. HEENT:  Normal  Neck: Supple without bruits or JVD. Lungs:  Resp regular and unlabored, CTA. Heart: RRR no s3, s4, or murmurs.  No rub. Abdomen: Soft, non-tender, non-distended, BS + x 4.  Extremities: No clubbing, cyanosis or edema. DP/PT/Radials 2+ and equal bilaterally.  Labs    CBC  Recent Labs  06/19/15 2015 06/20/15 0304  WBC 17.9* 12.5*  NEUTROABS 14.0*  --   HGB 10.4* 9.9*  HCT 32.0* 30.1*  MCV 86.5 86.2  PLT 321 307   Basic Metabolic Panel  Recent Labs  06/19/15 2015 06/20/15 0304  NA 134* 134*  K 3.9 3.0*  CL 98* 98*  CO2 23 25  GLUCOSE 118* 116*  BUN 15 15  CREATININE 1.02 0.92  CALCIUM 8.3* 8.0*   Liver Function Tests  Recent Labs  06/19/15 2015  AST 13*  ALT 23    ALKPHOS 77  BILITOT 2.2*  PROT 6.3*  ALBUMIN 2.9*   Thyroid Function Tests  Recent Labs  06/19/15 1937  TSH 2.218    Telemetry    RSR  Radiology    Dg Chest 2 View  06/19/2015  CLINICAL DATA:  Cough, fever EXAM: CHEST  2 VIEW COMPARISON:  04/18/2006 FINDINGS: There is significant cardiomegaly new from prior exam. There is trace left pleural effusion or scarring in left costophrenic angle. Mild left basilar atelectasis. No infiltrate or pulmonary edema. Bony thorax is unremarkable. IMPRESSION: Significant cardiomegaly new from prior exam. Trace left pleural effusion or scarring in left costophrenic angle. Mild left basilar atelectasis. No segmental infiltrate or pulmonary edema. Electronically Signed   By: Natasha MeadLiviu  Pop M.D.   On: 06/19/2015 12:27   Ct Chest W Contrast  06/20/2015  CLINICAL DATA:  Pericardial effusion EXAM: CT CHEST WITH CONTRAST TECHNIQUE: Multidetector CT imaging of the chest was performed during intravenous contrast administration. CONTRAST:  75mL ISOVUE-300 IOPAMIDOL (ISOVUE-300) INJECTION 61% COMPARISON:  Chest radiographs dated 06/19/2015 FINDINGS: Mediastinum/Nodes: Moderate pericardial effusion, measuring simple fluid density, with indwelling pericardial drain.  No evidence of thoracic aortic aneurysm. Small mediastinal lymph nodes which do not meet pathologic CT size criteria. Visualized thyroid is unremarkable. Lungs/Pleura: Small bilateral pleural effusions. Associated patchy bilateral lower lobe opacities, favored to reflect compressive atelectasis. Evaluation of lung parenchyma is constrained by respiratory motion. Given that constraint, there are no suspicious pulmonary nodules. No pneumothorax. Upper abdomen: Visualized upper abdomen is unremarkable. Musculoskeletal: Visualized osseous structures are within normal limits. IMPRESSION: Moderate pericardial effusion with indwelling pericardial drain. Small bilateral pleural effusions. Associated patchy bilateral lower  lobe opacities, likely compressive atelectasis. Electronically Signed   By: Charline Bills M.D.   On: 06/20/2015 14:16   Limited 2D Echocardiogram 4.25.2017  Study Conclusions   - Left ventricle: The cavity size was normal. Wall thickness was   normal. Systolic function was normal. The estimated ejection   fraction was in the range of 55% to 60%. Wall motion was normal;   there were no regional wall motion abnormalities. - Pericardium, extracardiac: A moderate pericardial effusion was   identified. The fluid exhibited a fibrinous appearance.   Impressions:   - Limited study to reassess pericardial effusion; normal LV   systolic function; moderate pericardial effusion with fibrinous   stranding; no significant respiratory variation; no RV diastolic   collapse; IVC not significantly dilated; effusion smaller   compared to 06/19/15. _____________   Assessment & Plan    1.  Pericardial Effusion/Acute Pericarditis/Viral Illness:  Admitted from [place] office on 4/24 with RUQ and chest discomfort assoc with n/v.  Found to have low-grade fever, tachycardia, relative hypotension and large pericardial effusion with IVC dilation and evidence of tamponade.  S/P pericardial drain late on 4/24 with removal of 700 ml of serosanguineous fluid.  Placed on colchicine and ibuprofen for presumed viral illness (chest congestion/cold 3 wks ago)/pericarditis.  CT chest 4/25 w/o suspicious pulmonary nodules.  Small non-pathologic mediastinal lymph nodes noted.  Pathology notable for mixed lymphoid population favoring reactive process.  Limited echo 4/25 with moderate effusion and w/o tamponade physiology.  Pericardial drain out 4/25. This AM feeling much better, though still with mild, pleuritic c/p.  Cont ibuprofen and colchicine.  Plan f/u limited echo (? Tomorrow - will d/w Dr. Mayford Knife).  Add PPI given anemia and NSAID.  2.  Normocytic Anemia:   H/H lower yesterday (12.5/36.5  10.4/32  9.9/30.1).  Add PPI  in setting of high dose NSAID.  He had a nl BM yesterday.  Follow up today.  3.  Hypokalemia:  Supplemented yesterday.  Follow-up today.  Signed, Nicolasa Ducking NP

## 2015-06-21 NOTE — Progress Notes (Signed)
Pt waiting on ride at this time.  Remains free of pain and has no s/s of any acute distress.

## 2015-06-21 NOTE — Progress Notes (Signed)
  Echocardiogram 2D Echocardiogram has been performed.  Delcie RochENNINGTON, Anthonee Gelin 06/21/2015, 11:17 AM

## 2015-06-21 NOTE — Discharge Instructions (Signed)
**  PLEASE REMEMBER TO BRING ALL OF YOUR MEDICATIONS TO EACH OF YOUR FOLLOW-UP OFFICE VISITS.  Keep chest wall catheter insertion site clean and dry.

## 2015-06-21 NOTE — Discharge Summary (Signed)
Discharge Summary   Patient ID: Johnathan Ford,  MRN: [id number], DOB/AGE: 08-15-1995 20 y.o.  Admit date: 06/19/2015 Discharge date: 06/21/2015  Primary Care Provider: Lilyan Punt Primary Cardiologist: Pt will f/u with Junius Argyle, MD  Discharge Diagnoses    Principal Problem:   Pericardial effusion   **s/p pericardiocentesis with removal of 700 mL of serosanguinous fluid.  **Pathology suggestive of reactive process.  **Chest CT w/o pathologic lymphadenopathy or suspicious nodules.  Active Problems:   Acute pericarditis   Viral syndrome   Normocytic anemia   Hypokalemia  Allergies No Known Allergies  Diagnostic Studies/Procedures    2D Echocardiogram 4.24.2017  Study Conclusions  - Left ventricle: Difficult to accurately estimate LVEF with  tachycardia Appears mildly depressed at approximately 45%  The cavity size was normal. Wall thickness was normal. - Right ventricle: Systolic function was mildly reduced. - Pericardium, extracardiac: Large pericardial effusion surrounds  heart Measures 33 mm in maximal dimension There is some stranding  in pericardial space  There is indentation of RV in diastole There is RA indentation  but no collapse There is variation (greater than 25%) in mitral  inflow with inspiration. The IVC is dilated with blunted  respiratory variation with sniff. Ovreall does not meet full  criteria for tamponade, borderline Clinical correlation  indicated. _____________   Pericardiocentesis 4.24.2017  Pericardium 700 mL of serosanguinous fluid was removed from the pericardial cavity. There are hemodynamic findings suggestive of pericardial tamponade. _____________   Limited 2D Echocardiogram 4.25.2017  Study Conclusions   - Left ventricle: The cavity size was normal. Wall thickness was   normal. Systolic function was normal. The estimated ejection   fraction was in the range of 55% to 60%. Wall motion was normal;   there  were no regional wall motion abnormalities. - Pericardium, extracardiac: A moderate pericardial effusion was   identified. The fluid exhibited a fibrinous appearance.   Impressions:   - Limited study to reassess pericardial effusion; normal LV   systolic function; moderate pericardial effusion with fibrinous   stranding; no significant respiratory variation; no RV diastolic   collapse; IVC not significantly dilated; effusion smaller   compared to 06/19/15. _____________   CT of the Chest w/o Contrast 4.25.2017  IMPRESSION: Moderate pericardial effusion with indwelling pericardial drain.   Small bilateral pleural effusions. Associated patchy bilateral lower lobe opacities, likely compressive atelectasis. _____________   Limited 2D Echocardiogram 4.26.2017  Study Conclusions  - Left ventricle: The cavity size was normal. Systolic function was  normal. The estimated ejection fraction was in the range of 55%  to 60%. Wall motion was normal; there were no regional wall  motion abnormalities. Left ventricular diastolic function  parameters were normal. - Pericardium, extracardiac: A moderate pericardial effusion was  identified circumferential to the heart (1.1cm) with echodense  consistency of hemopericardium. There was no evidence of  hemodynamic compromise. Effusion is improved post  pericardiocentesis (original 3cm). _____________   History of Present Illness  20 y/o ? without a prior cardiac history.  He was in his usual state of health until approximately 3 wks prior to admission when he developed upper respiratory symptoms including chest congestion, cough, and low grade fever.  He had a full recovery and felt better but then began to experience RUQ and chest discomfort, which became associated with nausea, vomiting, and recurrent low grade fever.  He was seen by his PCP on the morning of 4/24 and was febrile and tachycardic.  A CXR was performed and showed  new,  significant cardiomegaly.  In that setting, there was concern for pericardial effusion and he was referred to cardiology for an echo and office evaluation.  Echo was performed in our [place] office and showed a larg pericardial effusion with evidence for tamponade.  He was given a 500 mL bolus of NS in clinic and was subsequently transferred via [facility] for further evaluation.  Hospital Course   Consultants: None   Following admission, pt was [facility] to [facility] catheterization laboratory where he underwent successful pericardiocentesis with drain placement.  700 mL of serosanguinous fluid was drawn off and sent to pathology.  He was placed on high dose ibuprofen and colchicine and remained hemodynamically stable overnight.  Follow-up limited echo on 4/25 showed stable, moderate pericardial effusion without tamponade.  The pericardial drain was discontinued.  CT of the chest was performed and did not show any pathologic lymphadenopathy or suspicious nodules.  Pathology returned showing mixed lymphoid population, favoring a reactive process.  Follow-up limited echo this AM, again showed a stable, moderate pericardial effusion without tamponade, and thus it was felt that Mr. Johnathan Ford could be safely discharged.  He will remain on ibuprofen (duration of Rx to be determined) and colchicine (3 mos) and in the setting of normocytic anemia without evidence of active bleeding, we have also added PPI therapy, while he is on ibuprofen.    He will be discharged home today in stable condition.  We have arranged for follow-up limited echo in 1 week with follow-up appointment on the same day. _____________  Discharge Vitals Blood pressure 109/51, pulse 94, temperature 98.9 F (37.2 C), temperature source Oral, resp. rate 26, height 5\' 10"  (1.778 m), weight 181 lb 14.1 oz (82.5 kg), SpO2 97 %.  Filed Weights   06/20/15 0000  Weight: 181 lb 14.1 oz (82.5 kg)    Labs      CBC  Recent Labs  06/19/15 2015 06/20/15 0304 06/21/15 1049  WBC 17.9* 12.5* 11.2*  NEUTROABS 14.0*  --   --   HGB 10.4* 9.9* 10.0*  HCT 32.0* 30.1* 31.1*  MCV 86.5 86.2 86.9  PLT 321 307 361   Basic Metabolic Panel  Recent Labs  06/20/15 0304 06/21/15 1049  NA 134* 136  K 3.0* 3.8  CL 98* 102  CO2 25 24  GLUCOSE 116* 87  BUN 15 14  CREATININE 0.92 0.87  CALCIUM 8.0* 8.3*   Liver Function Tests  Recent Labs  06/19/15 2015  AST 13*  ALT 23  ALKPHOS 77  BILITOT 2.2*  PROT 6.3*  ALBUMIN 2.9*   Thyroid Function Tests  Recent Labs  06/19/15 1937  TSH 2.218    Disposition   Pt is being discharged home today in good condition.  Follow-up Plans & Appointments    Follow-up Information    Follow up with Laqueta LindenKONESWARAN, SURESH A, MD On 06/29/2015.   Specialty:  Cardiology   Why:  11:40 AM   Contact information:   [address] [place] [address] [phone]       Follow up with [facility] [place] [facility] On 06/29/2015.   Why:  10:30 AM - for repeat echocardiogram.   Contact information:   [address] [place] [address] [phone]      Follow up with Lilyan PuntScott Luking, MD.   Specialty:  Family Medicine   Why:  as scheduled.   Contact information:   [address] [place] [address] [phone]  Discharge Medications     Medication List    STOP taking these medications        cefPROZIL 500 MG tablet  Commonly known as:  CEFZIL     ondansetron 8 MG tablet  Commonly known as:  ZOFRAN      TAKE these medications        colchicine 0.6 MG tablet  Take 1 tablet (0.6 mg total) by mouth daily.     ibuprofen 800 MG tablet  Commonly known as:  ADVIL,MOTRIN  Take 1 tablet (800 mg total) by mouth 3 (three) times daily.     pantoprazole 40 MG tablet  Commonly known as:  PROTONIX  Take 1 tablet (40 mg total) by mouth daily at 6 (six) AM.         Outstanding Labs/Studies   Follow-up limited echo upon return  visit on 5/4. Follow-up CBC.  Duration of Discharge Encounter   Greater than 30 minutes including physician time.  Signed, Nicolasa Ducking NP 06/21/2015, 12:52 PM

## 2015-06-21 NOTE — Progress Notes (Signed)
Ok with Ward Givenshris Berge NP to keep pt off telemetry for dc.

## 2015-06-21 NOTE — Progress Notes (Signed)
Dc instructions given to pt at this time.  Pt verbalized understanding of all instructions.  No s/s of any acute distress.  No c/o pain.   

## 2015-06-25 LAB — CULTURE, BODY FLUID-BOTTLE: CULTURE: NO GROWTH

## 2015-06-25 LAB — CULTURE, BODY FLUID W GRAM STAIN -BOTTLE

## 2015-06-29 ENCOUNTER — Ambulatory Visit (INDEPENDENT_AMBULATORY_CARE_PROVIDER_SITE_OTHER): Payer: 59 | Admitting: Cardiovascular Disease

## 2015-06-29 ENCOUNTER — Ambulatory Visit (HOSPITAL_COMMUNITY)
Admission: RE | Admit: 2015-06-29 | Discharge: 2015-06-29 | Disposition: A | Discharge status: Home / Self Care | Payer: 59 | Source: Ambulatory Visit | Attending: Nurse Practitioner | Admitting: Nurse Practitioner

## 2015-06-29 ENCOUNTER — Encounter: Payer: Self-pay | Admitting: Cardiovascular Disease

## 2015-06-29 VITALS — BP 110/60 | HR 95 | Ht 70.0 in | Wt 178.0 lb

## 2015-06-29 DIAGNOSIS — B3323 Viral pericarditis: Secondary | ICD-10-CM

## 2015-06-29 DIAGNOSIS — I319 Disease of pericardium, unspecified: Secondary | ICD-10-CM | POA: Diagnosis not present

## 2015-06-29 DIAGNOSIS — D649 Anemia, unspecified: Secondary | ICD-10-CM | POA: Diagnosis not present

## 2015-06-29 DIAGNOSIS — E876 Hypokalemia: Secondary | ICD-10-CM | POA: Diagnosis not present

## 2015-06-29 DIAGNOSIS — I313 Pericardial effusion (noninflammatory): Secondary | ICD-10-CM

## 2015-06-29 DIAGNOSIS — I314 Cardiac tamponade: Secondary | ICD-10-CM | POA: Diagnosis not present

## 2015-06-29 DIAGNOSIS — I340 Nonrheumatic mitral (valve) insufficiency: Secondary | ICD-10-CM | POA: Insufficient documentation

## 2015-06-29 DIAGNOSIS — I3139 Other pericardial effusion (noninflammatory): Secondary | ICD-10-CM

## 2015-06-29 LAB — CBC
HEMATOCRIT: 40.9 % (ref 38.5–50.0)
HEMOGLOBIN: 13.8 g/dL (ref 13.2–17.1)
MCH: 29.1 pg (ref 27.0–33.0)
MCHC: 33.7 g/dL (ref 32.0–36.0)
MCV: 86.3 fL (ref 80.0–100.0)
MPV: 8.4 fL (ref 7.5–12.5)
Platelets: 345 10*3/uL (ref 140–400)
RBC: 4.74 MIL/uL (ref 4.20–5.80)
RDW: 13.7 % (ref 11.0–15.0)
WBC: 8.3 10*3/uL (ref 3.8–10.8)

## 2015-06-29 LAB — BASIC METABOLIC PANEL
BUN: 15 mg/dL (ref 7–20)
CO2: 27 mmol/L (ref 20–31)
Calcium: 9.3 mg/dL (ref 8.9–10.4)
Chloride: 101 mmol/L (ref 98–110)
Creat: 0.74 mg/dL (ref 0.60–1.26)
Glucose, Bld: 97 mg/dL (ref 65–99)
POTASSIUM: 4.8 mmol/L (ref 3.8–5.1)
Sodium: 140 mmol/L (ref 135–146)

## 2015-06-29 NOTE — Patient Instructions (Signed)
Your physician recommends that you schedule a follow-up appointment in: 3 months with Dr Purvis SheffieldKoneswaran   Continue Ibuprofen 1 more week   Lab work today: CBC,BMET      Thank you for choosing [facility] !

## 2015-06-29 NOTE — Progress Notes (Signed)
Patient ID: Johnathan Ford, male   DOB: 10/05/1995, 20 y.o.   MRN: [id number]      SUBJECTIVE: The patient is a 20 year old male who I am meeting for the first time. He was recently hospitalized for viral pericarditis and a large pericardial effusion with tamponade physiology. He had a pericardial drain placed on 4/24 with removal of 700 mL of serosanguineous fluid. He was placed on colchicine and ibuprofen. His hemoglobin was initially 12.5 and was 10 on discharge, and was started on a PPI. Follow-up echocardiogram performed today demonstrated normal left ventricular systolic function, EF 55-60%, normal diastolic function, with no significant residual pericardial effusion and significant improvement compared to the study dated April 26.  He is doing well and denies chest pain, fevers, and shortness of breath.  He had been doing CrossFit and would like to begin doing light exercises.  Review of Systems: As per "subjective", otherwise negative.  No Known Allergies  Current Outpatient Prescriptions  Medication Sig Dispense Refill  . colchicine 0.6 MG tablet Take 1 tablet (0.6 mg total) by mouth daily. 90 tablet 1  . ibuprofen (ADVIL,MOTRIN) 800 MG tablet Take 1 tablet (800 mg total) by mouth 3 (three) times daily. 42 tablet 2  . pantoprazole (PROTONIX) 40 MG tablet Take 1 tablet (40 mg total) by mouth daily at 6 (six) AM. 30 tablet 3   No current facility-administered medications for this visit.    Past Medical History  Diagnosis Date  . Acute pericarditis     a. 05/2015-->d/c on ibuprofen and colchicine.  . Pericardial effusion     a. 06/19/2015 in setting of pericarditis s/p pericardiocentesis ( , path: reactive process); b. 06/20/2015 CT Chest: no pathologic adn, no suspicious pulm nodules; c. 06/21/2015 Limited Echo: EF 55-60%, no rwma, mod circumferential pericardial effusion with echodense consistency of hemopericardium.  No hemodynamic compromise, overall improved.  . Normocytic  anemia     a. Noted 05/2015->no evidence of bleeding.    Past Surgical History  Procedure Laterality Date  . Cardiac catheterization N/A 06/19/2015    Procedure: Pericardiocentesis;  Surgeon: Peter M Swaziland, MD;  Location: [facility] INVASIVE CV LAB;  Service: Cardiovascular;  Laterality: N/A;    Social History   Social History  . Marital Status: Single    Spouse Name: N/A  . Number of Children: N/A  . Years of Education: N/A   Occupational History  . Not on file.   Social History Main Topics  . Smoking status: Never Smoker   . Smokeless tobacco: Not on file  . Alcohol Use: No  . Drug Use: No  . Sexual Activity: No   Other Topics Concern  . Not on file   Social History Narrative     Filed Vitals:   06/29/15 1124  BP: 110/60  Pulse: 95  Height:  (1.778 m)  Weight: 178 lb (80.74 kg)  SpO2: 98%    PHYSICAL EXAM General: NAD HEENT: Normal. Neck: No JVD, no thyromegaly. Lungs: Clear to auscultation bilaterally with normal respiratory effort. CV: Nondisplaced PMI.  Regular rate and rhythm, normal S1/S2, no S3/S4, no murmur. No pretibial or periankle edema.  No carotid bruit.   Abdomen: Soft, nontender, no distention.  Neurologic: Alert and oriented.  Psych: Normal affect. Skin: Normal. Musculoskeletal: No gross deformities.    ECG: Most recent ECG reviewed.      ASSESSMENT AND PLAN: 1. Viral pericarditis/pericardial effusion: Asymptomatic with no residual effusion. Will continue ibuprofen for one more week and he should complete  3 months of treatment with colchicine. Can begin light exercises but not full CrossFit until a full month of medical therapy has been completed.  2. Anemia: Will check CBC.  3. Hypokalemia: Will check BMET.  Dispo: fu 3 months.  Prentice DockerSuresh Koneswaran, M.D., F.A.C.C.

## 2015-07-04 ENCOUNTER — Telehealth: Payer: Self-pay

## 2015-07-04 NOTE — Telephone Encounter (Signed)
-----   Message from Laqueta LindenSuresh A Koneswaran, MD sent at 07/02/2015  7:50 AM EDT ----- Normal.

## 2015-07-04 NOTE — Telephone Encounter (Signed)
lmtcb- 5/9 - lm

## 2015-07-06 ENCOUNTER — Ambulatory Visit (INDEPENDENT_AMBULATORY_CARE_PROVIDER_SITE_OTHER): Payer: 59 | Admitting: Family Medicine

## 2015-07-06 ENCOUNTER — Ambulatory Visit (INDEPENDENT_AMBULATORY_CARE_PROVIDER_SITE_OTHER): Payer: 59 | Admitting: Cardiology

## 2015-07-06 ENCOUNTER — Encounter: Payer: Self-pay | Admitting: *Deleted

## 2015-07-06 ENCOUNTER — Encounter: Payer: Self-pay | Admitting: Family Medicine

## 2015-07-06 ENCOUNTER — Encounter: Payer: Self-pay | Admitting: Cardiology

## 2015-07-06 ENCOUNTER — Ambulatory Visit (HOSPITAL_COMMUNITY)
Admission: RE | Admit: 2015-07-06 | Discharge: 2015-07-06 | Disposition: A | Discharge status: Home / Self Care | Payer: 59 | Source: Ambulatory Visit | Attending: Cardiology | Admitting: Cardiology

## 2015-07-06 VITALS — BP 118/74 | Temp 99.1°F | Ht 70.0 in | Wt 179.0 lb

## 2015-07-06 DIAGNOSIS — J329 Chronic sinusitis, unspecified: Secondary | ICD-10-CM

## 2015-07-06 DIAGNOSIS — I313 Pericardial effusion (noninflammatory): Secondary | ICD-10-CM | POA: Diagnosis not present

## 2015-07-06 DIAGNOSIS — R071 Chest pain on breathing: Secondary | ICD-10-CM | POA: Insufficient documentation

## 2015-07-06 DIAGNOSIS — I340 Nonrheumatic mitral (valve) insufficiency: Secondary | ICD-10-CM | POA: Diagnosis not present

## 2015-07-06 DIAGNOSIS — I071 Rheumatic tricuspid insufficiency: Secondary | ICD-10-CM | POA: Diagnosis not present

## 2015-07-06 DIAGNOSIS — I314 Cardiac tamponade: Secondary | ICD-10-CM | POA: Diagnosis not present

## 2015-07-06 LAB — ECHOCARDIOGRAM COMPLETE
Height: 70 in
Weight: 2864 oz

## 2015-07-06 MED ORDER — AMOXICILLIN 500 MG PO CAPS: 500.0000 mg | ORAL_CAPSULE | Freq: Three times a day (TID) | ORAL | Status: DC

## 2015-07-06 MED ORDER — PREDNISONE 10 MG PO TABS: ORAL_TABLET | ORAL | Status: DC

## 2015-07-06 MED FILL — AMOXICILLIN 500 MG CAPSULE: 500 | 10 days supply | Qty: 30 | Fill #0

## 2015-07-06 MED FILL — predniSONE 10 MG TABS: 10 | 40 days supply | Qty: 100 | Fill #0

## 2015-07-06 NOTE — Progress Notes (Signed)
   Subjective:    Patient ID: Johnathan Ford, male    DOB: 1995-07-14, 20 y.o.   MRN: [id number]  Cough This is a new problem. Episode onset: 2 days ago. Associated symptoms include chest pain, a fever and headaches. Treatments tried: ibuprofen.   Started aching , running feverlow-grade at most and 99  Cough butNo productive cough  Some runny nose stopped up a bitnasal region  Frontal hreadachecomments had frontal headache for the past couple days. This is associated with nasal congestion.   Describes the chest discomfort is achy right-sided chest worse with deep breath   Review of Systems  Constitutional: Positive for fever.  Respiratory: Positive for cough.   Cardiovascular: Positive for chest pain.  Neurological: Positive for headaches.       Objective:   Physical Exam  Alert no apparent distress H&T moderate nasal congestion plus minus frontal tenderness TMs normal pharynx some drainage irritation neck supple lungs clear no crackles heart regular rate and rhythm no rubs auscultated      Assessment & Plan:  Impression acute respiratory illness , located somewhat by recent pericarditis. Full records reviewed. Last echocardiogram showed virtual resolution of pericardial effusion. I think this is a separate respiratory illness with a nasal congestion frontal headache etc. And not associated with a recurrence of the pericardial challenge. Trial of antibiotics symptom care discussed warning signs discussed carefully if similar symptoms recur we can always get back to cardiologist sooner than later on colchicine currently WSL

## 2015-07-06 NOTE — Assessment & Plan Note (Signed)
Recent admission and treatment for acute pericarditis 4/224-06/21/15 Now with fever and some recurrent effusion

## 2015-07-06 NOTE — Progress Notes (Signed)
Tylenol 1 Gm po  Given by mother ,ok per L Kilroy PA-C

## 2015-07-06 NOTE — Patient Instructions (Addendum)
Your physician recommends that you schedule a follow-up appointment with Dr. Purvis SheffieldKoneswaran on Tues. @ 4:20  Your physician has recommended you make the following change in your medication:   Stop Taking Advil   Start Prednisone Take 40 mg for 10 Days, then 30 mg for 10 Days, and then 20 mg for 10 Days, 10 mg for 10 Days and then STOP.   CONTINUE COLCHICINE   CALL THE OFFICE IF YOU STATUS CHANGES    If you need a refill on your cardiac medications before your next appointment, please call your pharmacy.  Thank you for choosing [facility]!

## 2015-07-06 NOTE — Progress Notes (Signed)
07/06/2015 Johnathan Ford   05/15/1995  [id number]  Primary Physician Lilyan PuntScott Luking, MD Primary Cardiologist: Dr Purvis SheffieldKoneswaran  HPI:  20 year old male who was recently hospitalized 4/24/-06/21/15 for viral pericarditis and a large pericardial effusion with tamponade physiology. He had a pericardial drain placed on 4/24 with removal of 700 mL of serosanguineous fluid. He was placed on colchicine and ibuprofen. His hemoglobin was initially 12.5 and was 10 on discharge, and was started on a PPI. Follow-up echocardiogram performed 06/29/15 demonstrated normal left ventricular systolic function, EF 55-60%, normal diastolic function, with no significant residual pericardial effusion and significant improvement compared to the study dated April 26.           The pt is seen in the office today as a referral from his PCP. He presented there with fever and weakness. Temp in the office is 102. He has no obvious source of an infection. A repeat echo today suggested some recurrence of his pericardial effusion (see report).    Current Outpatient Prescriptions  Medication Sig Dispense Refill  . colchicine 0.6 MG tablet Take 1 tablet (0.6 mg total) by mouth daily. 90 tablet 1  . pantoprazole (PROTONIX) 40 MG tablet Take 1 tablet (40 mg total) by mouth daily at 6 (six) AM. 30 tablet 3  . amoxicillin (AMOXIL) 500 MG capsule Take 1 capsule (500 mg total) by mouth 3 (three) times daily. (Patient not taking: Reported on 07/06/2015) 30 capsule 0  . ibuprofen (ADVIL,MOTRIN) 800 MG tablet Take 1 tablet (800 mg total) by mouth 3 (three) times daily. 42 tablet 2   No current facility-administered medications for this visit.    No Known Allergies  Social History   Social History  . Marital Status: Single    Spouse Name: N/A  . Number of Children: N/A  . Years of Education: N/A   Occupational History  . Not on file.   Social History Main Topics  . Smoking status: Never Smoker   . Smokeless tobacco: Not on file    . Alcohol Use: No  . Drug Use: No  . Sexual Activity: No   Other Topics Concern  . Not on file   Social History Narrative     Review of Systems: General: negative for chills, fever, night sweats or weight changes.  Cardiovascular: negative for chest pain, dyspnea on exertion, edema, orthopnea, palpitations, paroxysmal nocturnal dyspnea or shortness of breath Dermatological: negative for rash Respiratory: negative for cough or wheezing Urologic: negative for hematuria Abdominal: negative for nausea, vomiting, diarrhea, bright red blood per rectum, melena, or hematemesis Neurologic: negative for visual changes, syncope, or dizziness All other systems reviewed and are otherwise negative except as noted above.    Blood pressure 100/64, pulse 124, temperature 102.3 F (39.1 C), weight 179 lb 9.6 oz (81.466 kg), SpO2 99 %.  General appearance: alert, cooperative and no distress Neck: no carotid bruit and no JVD Lungs: clear to auscultation bilaterally Heart: regular rate and rhythm and no obvious rub Abdomen: soft, non-tender; bowel sounds normal; no masses,  no organomegaly Extremities: extremities normal, atraumatic, no cyanosis or edema Skin: Skin color, texture, turgor normal. No rashes or lesions Neurologic: Grossly normal   ASSESSMENT AND PLAN:   Acute pericarditis Recent admission and treatment for acute pericarditis 4/224-06/21/15 Now with fever and some recurrent effusion   PLAN  Discussed with Dr Diona BrownerMcDowell and Dr Gala RomneyBensimhon. Start Prednisone 40 mg daily, decreasing the daily dose by10 mg every 10 days. Consider resuming Advil when he  gets to Prednisone 10 mg daily but hold Advil for now. Continue colchicine and Protonix. He will need early follow up next week and he knows to contact us immediately for any change in his condition. He also should be out of work till he is seen next week.   Corine Shelter PA-C 07/06/2015 4:53 PM

## 2015-07-11 ENCOUNTER — Ambulatory Visit (INDEPENDENT_AMBULATORY_CARE_PROVIDER_SITE_OTHER): Payer: 59 | Admitting: Cardiovascular Disease

## 2015-07-11 ENCOUNTER — Encounter: Payer: Self-pay | Admitting: Cardiovascular Disease

## 2015-07-11 VITALS — BP 116/60 | HR 100 | Ht 70.0 in | Wt 177.0 lb

## 2015-07-11 DIAGNOSIS — I429 Cardiomyopathy, unspecified: Secondary | ICD-10-CM | POA: Diagnosis not present

## 2015-07-11 DIAGNOSIS — B3323 Viral pericarditis: Secondary | ICD-10-CM | POA: Diagnosis not present

## 2015-07-11 NOTE — Patient Instructions (Signed)
Your physician recommends that you schedule a follow-up appointment in June with Dr. Purvis SheffieldKoneswaran.  Your physician recommends that you continue on your current medications as directed. Please refer to the Current Medication list given to you today.  If you need a refill on your cardiac medications before your next appointment, please call your pharmacy.  Thank you for choosing [facility]!

## 2015-07-11 NOTE — Progress Notes (Signed)
Patient ID: Johnathan Ford, male   DOB: [birth year], 20 y.o.   MRN: [id number]      SUBJECTIVE: The patient returns for follow up after having a recurrent pericardial effusion after developing a fever and upper respiratory symptoms. Started on a prednisone taper by Hinda Glatter PA-C after a discussion with Dr. Diona Browner and Dr. Gala Romney 5/11.  He says "I feel like a completely different person". Had a low grade fever this past Saturday but none since. Denies chills, cough, chest pain, leg swelling, dizziness, shortness of breath, and palpitations.  Eager to return to work at least for half days at [employer] in [place], where he is employed by his brother.  Going on family vacation to [facility] 08/14/15.    Review of Systems: As per "subjective", otherwise negative.  No Known Allergies  Current Outpatient Prescriptions  Medication Sig Dispense Refill  . amoxicillin (AMOXIL) 500 MG capsule Take 1 capsule (500 mg total) by mouth 3 (three) times daily. 30 capsule 0  . colchicine 0.6 MG tablet Take 1 tablet (0.6 mg total) by mouth daily. 90 tablet 1  . pantoprazole (PROTONIX) 40 MG tablet Take 1 tablet (40 mg total) by mouth daily at 6 (six) AM. 30 tablet 3  . predniSONE (DELTASONE) 10 MG tablet Take 40 mg for 10 Days, Then 30 mg for 10 Days, Then 20 mg for 10 Days, Then 10 mg for 10 Days and then STOP 100 tablet 0   No current facility-administered medications for this visit.    Past Medical History  Diagnosis Date  . Acute pericarditis     a. 05/2015-->d/c on ibuprofen and colchicine.  . Pericardial effusion     a. 06/19/2015 in setting of pericarditis s/p pericardiocentesis ( , path: reactive process); b. 06/20/2015 CT Chest: no pathologic adn, no suspicious pulm nodules; c. 06/21/2015 Limited Echo: EF 55-60%, no rwma, mod circumferential pericardial effusion with echodense consistency of hemopericardium.  No hemodynamic compromise, overall improved.  . Normocytic anemia     a. Noted  05/2015->no evidence of bleeding.    Past Surgical History  Procedure Laterality Date  . Cardiac catheterization N/A 06/19/2015    Procedure: Pericardiocentesis;  Surgeon: Peter M Swaziland, MD;  Location: [facility] INVASIVE CV LAB;  Service: Cardiovascular;  Laterality: N/A;    Social History   Social History  . Marital Status: Single    Spouse Name: N/A  . Number of Children: N/A  . Years of Education: N/A   Occupational History  . Not on file.   Social History Main Topics  . Smoking status: Never Smoker   . Smokeless tobacco: Not on file  . Alcohol Use: No  . Drug Use: No  . Sexual Activity: No   Other Topics Concern  . Not on file   Social History Narrative     Filed Vitals:   07/11/15 1623  BP: 116/60  Pulse: 100  Height:  (1.778 m)  Weight: 177 lb (80.287 kg)  SpO2: 97%   HR by auscultation: 80-90 bpm.  PHYSICAL EXAM General: NAD HEENT: Normal. Neck: No JVD, no thyromegaly. Lungs: Clear to auscultation bilaterally with normal respiratory effort. CV: Nondisplaced PMI.  Regular rate and rhythm, normal S1No carotid bruit.   Abdomen: Soft, nontender, no distention.  Neurologic: Alert and oriented.  Psych: Normal affect. Skin: Normal. Musculoskeletal: No gross deformities.    ECG: Most recent ECG reviewed.      ASSESSMENT AND PLAN: 1. Acute viral pericarditis/pericardial effusion/myopericarditis: Symptomatically improved/stable. Now on prednisone  taper. Echo 5/11 EF 45%, diffusely hypokinetic, small posterior pericardial effusion with small to moderate sized fluid collection at the base of the right atrium and trivial degree of anterior pericardial fluid.  Will repeat limited echo at time of next visit to reassess pericardial effusion and LVEF.  I have written a note stating he can return to work for half days this week with no heavy lifting. If symptoms recur, he is to stop immediately. He can go back to work doing full days next week if he continues  to do well.  Dispo: fu week of 08/21/15.  Prentice DockerSuresh Maat Kafer, M.D., F.A.C.C.

## 2015-07-12 ENCOUNTER — Telehealth: Payer: Self-pay | Admitting: Cardiovascular Disease

## 2015-07-12 ENCOUNTER — Other Ambulatory Visit: Payer: Self-pay | Admitting: *Deleted

## 2015-07-12 MED ORDER — PANTOPRAZOLE SODIUM 40 MG PO TBEC: 40.0000 mg | DELAYED_RELEASE_TABLET | Freq: Every day | ORAL | Status: DC

## 2015-07-12 MED FILL — PANTOPRAZOLE SOD DR 40 MG T: 40 | 30 days supply | Qty: 30 | Fill #0

## 2015-07-12 NOTE — Telephone Encounter (Signed)
Sent in 60 day supply to [facility]

## 2015-07-12 NOTE — Telephone Encounter (Signed)
Can patient have 60 day supply of Protonix sent to [facility] due to it being cheaper. / tg

## 2015-07-12 NOTE — Telephone Encounter (Signed)
error 

## 2015-07-18 ENCOUNTER — Telehealth: Payer: Self-pay | Admitting: Family Medicine

## 2015-07-18 NOTE — Telephone Encounter (Signed)
I received a fax from [employer] [place] regarding this patient for disability claim and how long he will be out and what restrictions. Initially I saw the patient a few weeks ago but he was [facility] cardi[facility] and [facility] multiple times over the past several weeks. It would be best for this to go to his cardiologist for further filling out. Please discuss with the patient or his dad. His dad works as a Merchandiser, retailsupervisor for [employer] both are very nice people and should understand that the cardiologist would be the best one to fill this out then please forward this form to the cardiology office thank you

## 2015-08-11 MED FILL — PANTOPRAZOLE SOD DR 40 MG T: 40 | 30 days supply | Qty: 30 | Fill #1

## 2015-08-24 ENCOUNTER — Ambulatory Visit (INDEPENDENT_AMBULATORY_CARE_PROVIDER_SITE_OTHER): Payer: 59 | Admitting: Internal Medicine

## 2015-08-24 ENCOUNTER — Encounter: Payer: Self-pay | Admitting: Internal Medicine

## 2015-08-24 ENCOUNTER — Ambulatory Visit: Payer: 59 | Admitting: Cardiovascular Disease

## 2015-08-24 VITALS — BP 128/80 | HR 105 | Ht 69.0 in | Wt 186.0 lb

## 2015-08-24 DIAGNOSIS — I319 Disease of pericardium, unspecified: Secondary | ICD-10-CM

## 2015-08-24 DIAGNOSIS — I313 Pericardial effusion (noninflammatory): Secondary | ICD-10-CM

## 2015-08-24 DIAGNOSIS — I3139 Other pericardial effusion (noninflammatory): Secondary | ICD-10-CM

## 2015-08-24 NOTE — Progress Notes (Signed)
Cardiology Office Note   Date:  08/24/2015   ID:  Johnathan Ford, DOB 05-Oct-1995, MRN [id number]  PCP:  Lilyan PuntScott Luking, MD  Cardiologist:   Dietrich PatesPaula Chrisangel Eskenazi, MD    F/U of pericarditis   History of Present Illness: Johnathan Ford is a 20 y.o. male with a history of pericarditis  I saw him when he first presented in April 2017 with near tamponade  He underwent pericardiocentesis.  Work up negative  Placed on colchicine.  Echo in early may showed normal LV function and no effusion   The pt called later in may not feeling good  Chest pressure again  Limited echo showed small effusion and LVEF 45%  The patient was placed on a long steroid taper  He just finished last week  He has also remained on colchicine 1x per day He says he has been feeling good  He is playing baseball  Has not gone back to [employer]. Deneis chest pressure  No dizzines s No SOB  No palpitations   Felt back to normal several weeks ago         Outpatient Prescriptions Prior to Visit  Medication Sig Dispense Refill  . colchicine 0.6 MG tablet Take 1 tablet (0.6 mg total) by mouth daily. 90 tablet 1  . pantoprazole (PROTONIX) 40 MG tablet Take 1 tablet (40 mg total) by mouth daily at 6 (six) AM. 60 tablet 3  . amoxicillin (AMOXIL) 500 MG capsule Take 1 capsule (500 mg total) by mouth 3 (three) times daily. 30 capsule 0  . predniSONE (DELTASONE) 10 MG tablet Take 40 mg for 10 Days, Then 30 mg for 10 Days, Then 20 mg for 10 Days, Then 10 mg for 10 Days and then STOP 100 tablet 0   No facility-administered medications prior to visit.     Allergies:   Review of patient's allergies indicates no known allergies.   Past Medical History  Diagnosis Date  . Acute pericarditis     a. 05/2015-->d/c on ibuprofen and colchicine.  . Pericardial effusion     a. 06/19/2015 in setting of pericarditis s/p pericardiocentesis (700ml, path: reactive process); b. 06/20/2015 CT Chest: no pathologic adn, no suspicious pulm nodules; c. 06/21/2015  Limited Echo: EF 55-60%, no rwma, mod circumferential pericardial effusion with echodense consistency of hemopericardium.  No hemodynamic compromise, overall improved.  . Normocytic anemia     a. Noted 05/2015->no evidence of bleeding.    Past Surgical History  Procedure Laterality Date  . Cardiac catheterization N/A 06/19/2015    Procedure: Pericardiocentesis;  Surgeon: Peter M SwazilandJordan, MD;  [facility] LAB;  Service: Cardiovascular;  Laterality: N/A;     Social History:  The patient  reports that he has never smoked. He does not have any smokeless tobacco history on file. He reports that he does not drink alcohol or use illicit drugs.   Family History:  The patient's family history is not on file.    ROS:  Please see the history of present illness. All other systems are reviewed and  Negative to the above problem except as noted.    PHYSICAL EXAM: VS:  BP 128/80 mmHg  Pulse 105  Ht 5\' 9"  (1.753 m)  Wt 186 lb (84.369 kg)  BMI 27.45 kg/m2  SpO2 98%  GEN: Well nourished, well developed, in no acute distress HEENT: normal Neck: no JVD, carotid bruits, or masses Cardiac: RRR; no murmurs, rubs, or gallops,no edema  Respiratory:  clear to  auscultation bilaterally, normal work of breathing GI: soft, nontender, nondistended, + BS  No hepatomegaly  MS: no deformity Moving all extremities   Skin: warm and dry, no rash Neuro:  Strength and sensation are intact Psych: euthymic mood, full affect   EKG:  EKG is not ordered today.   Lipid Panel No results found for: CHOL, TRIG, HDL, CHOLHDL, VLDL, LDLCALC, LDLDIRECT    Wt Readings from Last 3 Encounters:  08/24/15 186 lb (84.369 kg) (85 %*, Z = 1.05)  07/11/15 177 lb (80.287 kg) (79 %*, Z = 0.79)  07/06/15 179 lb 9.6 oz (81.466 kg) (81 %*, Z = 0.87)   * Growth percentiles are based on CDC 2-20 Years data.      ASSESSMENT AND PLAN: 1  Pericarditis  Pt now 2 months from presentation  Had sl relapse of probable  myopericarditis in May  Rx steroids and colchicine.   He ahs been back to playing sports I have recomm that he continue the colchicine qd for now.  It is rel innocuous. Repeat limited echo next week For now I would not go full out on exercise  He can slowly start [employer].  OK to stop PPI  Hgb normalized   F/U based on echo  He is also followed by Fletcher AnonS Luking     Signed, Dietrich PatesPaula Virgilene Stryker, MD  08/24/2015 2:12 PM    [facility] [address], [place], [place]  [address] Phone: [phone]; Fax: [phone]

## 2015-08-24 NOTE — Patient Instructions (Signed)
Medication Instructions:  Your physician recommends that you continue on your current medications as directed. Please refer to the Current Medication list given to you today.   Labwork: NONE  Testing/Procedures: Your physician has requested that you have an echocardiogram. Echocardiography is a painless test that uses sound waves to create images of your heart. It provides your doctor with information about the size and shape of your heart and how well your heart's chambers and valves are working. This procedure takes approximately one hour. There are no restrictions for this procedure.    Follow-Up: Your physician recommends that you schedule a follow-up appointment in: TO BE DETERMINED    Any Other Special Instructions Will Be Listed Below (If Applicable).     If you need a refill on your cardiac medications before your next appointment, please call your pharmacy.   

## 2015-08-31 ENCOUNTER — Ambulatory Visit (HOSPITAL_COMMUNITY)
Admission: RE | Admit: 2015-08-31 | Discharge: 2015-08-31 | Disposition: A | Discharge status: Home / Self Care | Payer: 59 | Source: Ambulatory Visit | Attending: Internal Medicine | Admitting: Internal Medicine

## 2015-08-31 ENCOUNTER — Other Ambulatory Visit: Payer: Self-pay | Admitting: Internal Medicine

## 2015-08-31 DIAGNOSIS — I319 Disease of pericardium, unspecified: Secondary | ICD-10-CM | POA: Insufficient documentation

## 2015-08-31 DIAGNOSIS — I313 Pericardial effusion (noninflammatory): Secondary | ICD-10-CM

## 2015-08-31 DIAGNOSIS — I3139 Other pericardial effusion (noninflammatory): Secondary | ICD-10-CM

## 2015-08-31 NOTE — Progress Notes (Addendum)
*  PRELIMINARY RESULTS* Echocardiogram Limited 2D Echocardiogram has been performed.  Stacey DrainWhite, Camesha Farooq J 08/31/2015, 2:50 PM

## 2015-09-29 ENCOUNTER — Ambulatory Visit: Payer: 59 | Admitting: Cardiovascular Disease

## 2015-09-29 ENCOUNTER — Encounter: Payer: Self-pay | Admitting: Cardiovascular Disease

## 2015-09-29 ENCOUNTER — Encounter (INDEPENDENT_AMBULATORY_CARE_PROVIDER_SITE_OTHER): Payer: Self-pay

## 2015-11-27 ENCOUNTER — Encounter: Payer: Self-pay | Admitting: Family Medicine

## 2015-11-27 ENCOUNTER — Ambulatory Visit (HOSPITAL_COMMUNITY)
Admission: RE | Admit: 2015-11-27 | Discharge: 2015-11-27 | Disposition: A | Discharge status: Home / Self Care | Payer: 59 | Source: Ambulatory Visit | Attending: Family Medicine | Admitting: Family Medicine

## 2015-11-27 ENCOUNTER — Ambulatory Visit (INDEPENDENT_AMBULATORY_CARE_PROVIDER_SITE_OTHER): Payer: 59 | Admitting: Family Medicine

## 2015-11-27 VITALS — BP 108/72 | Ht 70.0 in | Wt 187.5 lb

## 2015-11-27 DIAGNOSIS — S9304XA Dislocation of right ankle joint, initial encounter: Secondary | ICD-10-CM | POA: Insufficient documentation

## 2015-11-27 DIAGNOSIS — S93401A Sprain of unspecified ligament of right ankle, initial encounter: Secondary | ICD-10-CM | POA: Diagnosis not present

## 2015-11-27 DIAGNOSIS — S8251XA Displaced fracture of medial malleolus of right tibia, initial encounter for closed fracture: Secondary | ICD-10-CM | POA: Diagnosis not present

## 2015-11-27 DIAGNOSIS — X58XXXA Exposure to other specified factors, initial encounter: Secondary | ICD-10-CM | POA: Insufficient documentation

## 2015-11-27 NOTE — Progress Notes (Signed)
   Subjective:    Patient ID: Johnathan Ford, male    DOB: 03-29-1995, 20 y.o.   MRN: [id number]  Ankle Injury   The incident occurred 2 days ago. The injury mechanism was a twisting injury. The pain is present in the right ankle.   came off a rope  Hit sideways  Sone swelling swollen  By four  Bruising  Hurts more yesterdy  Took prn ibuprofen. 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   Review of Systems   No headache, no major weight loss or weight gain, no chest pain no back pain abdominal pain no change in bowel habits complete ROS otherwise negative  Objective:  Physical Exam Alert vitals stable, NAD. Blood pressure good on repeat. HEENT normal. Lungs clear. Heart regular rate and rhythm. Substantial swelling and hematoma and med malleolus tend       Assessment & Plan:  Probable ankle spriain.Marland Kitchen. incr advil to 600 tid, local measures dis, x tray

## 2015-11-28 ENCOUNTER — Other Ambulatory Visit: Payer: Self-pay | Admitting: *Deleted

## 2015-11-28 DIAGNOSIS — S82891A Other fracture of right lower leg, initial encounter for closed fracture: Secondary | ICD-10-CM

## 2015-11-30 DIAGNOSIS — S93491A Sprain of other ligament of right ankle, initial encounter: Secondary | ICD-10-CM | POA: Diagnosis not present

## 2015-11-30 MED FILL — MELOXICAM 7.5 MG TABLET: 7.5 | 15 days supply | Qty: 30 | Fill #0

## 2015-12-18 DIAGNOSIS — S93402D Sprain of unspecified ligament of left ankle, subsequent encounter: Secondary | ICD-10-CM | POA: Diagnosis not present

## 2016-01-15 MED FILL — COLCHICINE 0.6 MG TABLET: 0.6 | 90 days supply | Qty: 90 | Fill #1

## 2016-01-15 MED FILL — MELOXICAM 7.5 MG TABLET: 7.5 | 15 days supply | Qty: 30 | Fill #1

## 2016-02-20 ENCOUNTER — Ambulatory Visit (INDEPENDENT_AMBULATORY_CARE_PROVIDER_SITE_OTHER): Payer: 59 | Admitting: Cardiology

## 2016-02-20 ENCOUNTER — Encounter: Payer: Self-pay | Admitting: Cardiology

## 2016-02-20 ENCOUNTER — Other Ambulatory Visit: Payer: Self-pay

## 2016-02-20 ENCOUNTER — Ambulatory Visit (HOSPITAL_COMMUNITY)
Admission: RE | Admit: 2016-02-20 | Discharge: 2016-02-20 | Disposition: A | Discharge status: Home / Self Care | Payer: 59 | Source: Ambulatory Visit | Attending: Cardiology | Admitting: Cardiology

## 2016-02-20 ENCOUNTER — Other Ambulatory Visit (HOSPITAL_COMMUNITY)
Admission: RE | Admit: 2016-02-20 | Discharge: 2016-02-20 | Disposition: A | Discharge status: Home / Self Care | Payer: 59 | Source: Ambulatory Visit | Attending: Cardiology | Admitting: Cardiology

## 2016-02-20 VITALS — BP 118/68 | HR 89 | Ht 69.0 in | Wt 179.0 lb

## 2016-02-20 DIAGNOSIS — R0789 Other chest pain: Secondary | ICD-10-CM

## 2016-02-20 DIAGNOSIS — J90 Pleural effusion, not elsewhere classified: Secondary | ICD-10-CM | POA: Insufficient documentation

## 2016-02-20 DIAGNOSIS — I319 Disease of pericardium, unspecified: Secondary | ICD-10-CM

## 2016-02-20 DIAGNOSIS — I313 Pericardial effusion (noninflammatory): Secondary | ICD-10-CM | POA: Insufficient documentation

## 2016-02-20 DIAGNOSIS — I3139 Other pericardial effusion (noninflammatory): Secondary | ICD-10-CM

## 2016-02-20 LAB — CBC
HCT: 39.9 % (ref 39.0–52.0)
Hemoglobin: 13.4 g/dL (ref 13.0–17.0)
MCH: 28.6 pg (ref 26.0–34.0)
MCHC: 33.6 g/dL (ref 30.0–36.0)
MCV: 85.1 fL (ref 78.0–100.0)
PLATELETS: 306 10*3/uL (ref 150–400)
RBC: 4.69 MIL/uL (ref 4.22–5.81)
RDW: 12.3 % (ref 11.5–15.5)
WBC: 10.3 10*3/uL (ref 4.0–10.5)

## 2016-02-20 LAB — COMPREHENSIVE METABOLIC PANEL
ALBUMIN: 3.7 g/dL (ref 3.5–5.0)
ALT: 69 U/L — ABNORMAL HIGH (ref 17–63)
ANION GAP: 10 (ref 5–15)
AST: 46 U/L — ABNORMAL HIGH (ref 15–41)
Alkaline Phosphatase: 84 U/L (ref 38–126)
BUN: 14 mg/dL (ref 6–20)
CHLORIDE: 97 mmol/L — AB (ref 101–111)
CO2: 26 mmol/L (ref 22–32)
Calcium: 8.8 mg/dL — ABNORMAL LOW (ref 8.9–10.3)
Creatinine, Ser: 0.87 mg/dL (ref 0.61–1.24)
GFR calc Af Amer: >60 mL/min (ref >60)
GFR calc non Af Amer: >60 mL/min (ref >60)
GLUCOSE: 93 mg/dL (ref 65–99)
POTASSIUM: 3.4 mmol/L — AB (ref 3.5–5.1)
SODIUM: 133 mmol/L — AB (ref 135–145)
TOTAL PROTEIN: 7.7 g/dL (ref 6.5–8.1)
Total Bilirubin: 0.5 mg/dL (ref 0.3–1.2)

## 2016-02-20 LAB — SEDIMENTATION RATE: SED RATE: 88 mm/h — AB (ref 0–16)

## 2016-02-20 LAB — C-REACTIVE PROTEIN: CRP: 15.2 mg/dL — ABNORMAL HIGH (ref <1.0)

## 2016-02-20 MED ORDER — COLCHICINE 0.6 MG PO TABS: 0.6000 mg | ORAL_TABLET | Freq: Every day | ORAL | 1 refills | Status: AC

## 2016-02-20 MED ORDER — PANTOPRAZOLE SODIUM 40 MG PO TBEC: 40.0000 mg | DELAYED_RELEASE_TABLET | Freq: Every day | ORAL | 3 refills | Status: DC

## 2016-02-20 MED ORDER — PANTOPRAZOLE SODIUM 40 MG PO TBEC: 40.0000 mg | DELAYED_RELEASE_TABLET | Freq: Every day | ORAL | 3 refills | Status: AC

## 2016-02-20 MED FILL — PANTOPRAZOLE SOD DR 40 MG T: 40 | 90 days supply | Qty: 90 | Fill #0

## 2016-02-20 NOTE — Progress Notes (Signed)
Clinical Summary Johnathan Ford is a 20 y.o.male seen today as an add on patient for recent chest pain.   1. Pericarditis/Pericardial effusion - admit 05/2015 with pericardial and pericardial effusion, near tamponade. Required pericardicentesis. Workup showed no clear etiology for effusion. Has had lingering issues with chest pain, had been on steroid taper and colchicine.    - recent chest pain. Started 4-5 days ago. Can be right or left sided. Sharp pain, 8-9/10. Worst with deep breaths, or laying down flat. Not reproducible with palpation. Constant for 4- 5 days. Has had some SOB which is new for him. - restarted colchicine bid, since then significant nausea and vomiting.    Past Medical History:  Diagnosis Date  . Acute pericarditis    a. 05/2015-->d/c on ibuprofen and colchicine.  . Normocytic anemia    a. Noted 05/2015->no evidence of bleeding.  . Pericardial effusion    a. 06/19/2015 in setting of pericarditis s/p pericardiocentesis (772m, path: reactive process); b. 06/20/2015 CT Chest: no pathologic adn, no suspicious pulm nodules; c. 06/21/2015 Limited Echo: EF 55-60%, no rwma, mod circumferential pericardial effusion with echodense consistency of hemopericardium.  No hemodynamic compromise, overall improved.     No Known Allergies   Current Outpatient Prescriptions  Medication Sig Dispense Refill  . colchicine 0.6 MG tablet Take 1 tablet (0.6 mg total) by mouth daily. (Patient not taking: Reported on 11/27/2015) 90 tablet 1  . pantoprazole (PROTONIX) 40 MG tablet Take 1 tablet (40 mg total) by mouth daily at 6 (six) AM. (Patient not taking: Reported on 11/27/2015) 60 tablet 3   No current facility-administered medications for this visit.      Past Surgical History:  Procedure Laterality Date  . CARDIAC CATHETERIZATION N/A 06/19/2015   Procedure: Pericardiocentesis;  Surgeon: Johnathan Ford;  [facility];  Service: Cardiovascular;  Laterality: N/A;      No Known Allergies    No family history on file.   Social History Mr. MPinzonreports that he has never smoked. He has never used smokeless tobacco. Mr. MDemuroreports that he does not drink alcohol.   Review of Systems CONSTITUTIONAL: No weight loss, fever, chills, weakness or fatigue.  HEENT: Eyes: No visual loss, blurred vision, double vision or yellow sclerae.No hearing loss, sneezing, congestion, runny nose or sore throat.  SKIN: No rash or itching.  CARDIOVASCULAR: per hpi RESPIRATORY: per hpi GASTROINTESTINAL: No anorexia, nausea, vomiting or diarrhea. No abdominal pain or blood.  GENITOURINARY: No burning on urination, no polyuria NEUROLOGICAL: No headache, dizziness, syncope, paralysis, ataxia, numbness or tingling in the extremities. No change in bowel or bladder control.  MUSCULOSKELETAL: No muscle, back pain, joint pain or stiffness.  LYMPHATICS: No enlarged nodes. No history of splenectomy.  PSYCHIATRIC: No history of depression or anxiety.  ENDOCRINOLOGIC: No reports of sweating, cold or heat intolerance. No polyuria or polydipsia.  .Marland Kitchen  Physical Examination Vitals:   02/20/16 1435  BP: 118/68  Pulse: 89   Vitals:   02/20/16 1435  Weight: 179 lb (81.2 kg)  Height: '5\' 9"'$  (1.753 m)    Gen: resting comfortably, no acute distress HEENT: no scleral icterus, pupils equal round and reactive, no palptable cervical adenopathy,  CV: RRR, no m/r/g, no jvd Resp: Clear to auscultation bilaterally GI: abdomen is soft, non-tender, non-distended, normal bowel sounds, no hepatosplenomegaly MSK: extremities are warm, no edema.  Skin: warm, no rash Neuro:  no focal deficits Psych: appropriate affect  Assessment and Plan  1. Pericarditis - recurrent chest pain. Echo shows no significnat pericardial effusion. EKG without significnat changes.  - GI side effects on colchcine bid, change to qday dosing. Start ibuprofen '800mg'$  tid.  - check ESR, CRP  F/u 2  weeks.       Johnathan Ford, M.D., F.A.C.C.

## 2016-02-20 NOTE — Patient Instructions (Signed)
Your physician recommends that you schedule a follow-up appointment in: 2 weeks with NP or PA   You may take Motrin 800 mg three times a day (every 8 hrs) for 10 days only   Get lab work done now  I refilled your colchicine and Protonix     Thank you for choosing [facility] !

## 2016-02-20 NOTE — Progress Notes (Signed)
*  PRELIMINARY RESULTS* Echocardiogram 2D Echocardiogram has been performed.  Jeryl Columbialliott, Sierrah Luevano 02/20/2016, 11:27 AM

## 2016-02-21 ENCOUNTER — Other Ambulatory Visit: Payer: Self-pay

## 2016-02-21 MED ORDER — POTASSIUM CHLORIDE CRYS ER 20 MEQ PO TBCR: 20.0000 meq | EXTENDED_RELEASE_TABLET | Freq: Every day | ORAL | 0 refills | Status: AC

## 2016-02-21 MED FILL — KLOR-CON M20 TABLET: 20 | 5 days supply | Qty: 5 | Fill #0

## 2016-02-23 ENCOUNTER — Ambulatory Visit (INDEPENDENT_AMBULATORY_CARE_PROVIDER_SITE_OTHER): Payer: 59 | Admitting: Family Medicine

## 2016-02-23 ENCOUNTER — Encounter: Payer: Self-pay | Admitting: Family Medicine

## 2016-02-23 VITALS — BP 110/80 | Ht 70.0 in | Wt 188.0 lb

## 2016-02-23 DIAGNOSIS — R740 Nonspecific elevation of levels of transaminase and lactic acid dehydrogenase [LDH]: Secondary | ICD-10-CM | POA: Diagnosis not present

## 2016-02-23 DIAGNOSIS — R7401 Elevation of levels of liver transaminase levels: Secondary | ICD-10-CM

## 2016-02-23 NOTE — Progress Notes (Signed)
   Subjective:    Patient ID: Johnathan Ford, male    DOB: 05/14/1995, 20 y.o.   MRN: [id number]  HPI Patient is here today because he saw his cardiologist on Tuesday and was told that his liver function test is abnormal and he needs to see his PCP concerning this. Patient does take some amino acids when he is working out but denies use of any other type of supplements. States overall he does try to eat healthy. He denies any chest pressure tightness pain jaundice hematuria rectal bleeding fever chills sweats. Denies alcohol abuse. Patient has no other concerns at this time.    Review of Systems See above.    Objective:   Physical Exam Lungs clear heart regular abdomen soft no enlargement of the liver noted no jaundice noted extremities no edema        Assessment & Plan:  Long discussion held regarding elevated transaminases-minimal elevation doubt that there is any type of serious underlying problem doubt fatty liver more than likely colchicine causing this but it could be that amino acids he is taking I would recommend some additional lab work. May need additional lab work down the road as well await the results of these.

## 2016-02-24 LAB — HEPATIC FUNCTION PANEL
ALT: 75 IU/L — AB (ref 0–44)
AST: 24 IU/L (ref 0–40)
Albumin: 4.3 g/dL (ref 3.5–5.5)
Alkaline Phosphatase: 99 IU/L (ref 39–117)
Bilirubin Total: <0.2 mg/dL (ref 0.0–1.2)
Bilirubin, Direct: 0.06 mg/dL (ref 0.00–0.40)
Total Protein: 7.1 g/dL (ref 6.0–8.5)

## 2016-02-24 LAB — FERRITIN: FERRITIN: 143 ng/mL (ref 30–400)

## 2016-02-24 LAB — CERULOPLASMIN: CERULOPLASMIN: 39.7 mg/dL — AB (ref 16.0–31.0)

## 2016-02-24 LAB — IRON AND TIBC
IRON SATURATION: 20 % (ref 15–55)
IRON: 58 ug/dL (ref 38–169)
Total Iron Binding Capacity: 286 ug/dL (ref 250–450)
UIBC: 228 ug/dL (ref 111–343)

## 2016-02-24 LAB — HEPATITIS C ANTIBODY

## 2016-02-24 LAB — HEPATITIS B SURFACE ANTIGEN: Hepatitis B Surface Ag: NEGATIVE

## 2016-02-24 LAB — ANA: ANA: NEGATIVE

## 2016-03-06 ENCOUNTER — Encounter: Payer: Self-pay | Admitting: Family Medicine

## 2016-03-06 ENCOUNTER — Telehealth: Payer: Self-pay | Admitting: Pediatrics

## 2016-03-06 ENCOUNTER — Ambulatory Visit: Payer: 59 | Admitting: Physician Assistant

## 2016-03-06 DIAGNOSIS — B349 Viral infection, unspecified: Secondary | ICD-10-CM

## 2016-03-06 NOTE — Telephone Encounter (Signed)
-----   Message from Babs SciaraScott A Luking, MD sent at 03/06/2016  9:12 AM EST ----- A letter was completed to be sent to the patient regarding his lab findings. In this letter I instruct him to repeat liver profile in April. Please go ahead with printing a order for him to do a liver profile in write on the requisition for it to be done in April. Please give this requisition to Alcario Droughtrica so she can send that with the letter when it gets mailed

## 2016-03-06 NOTE — Telephone Encounter (Signed)
Orders entered, printed, and given to MontgomeryErica.

## 2016-03-14 ENCOUNTER — Ambulatory Visit: Payer: 59 | Admitting: Adult Health

## 2017-03-16 IMAGING — DX DG ANKLE COMPLETE 3+V*R*
3 series · 3 of 3 positions shown · non-contrast
Comparison: None

CLINICAL DATA: Turned his RIGHT foot/ankle while doing cross fit 2
days ago, bruising, swelling, ankle sprain initial encounter

EXAM:
RIGHT ANKLE - COMPLETE 3+ VIEW

[ankle ap]
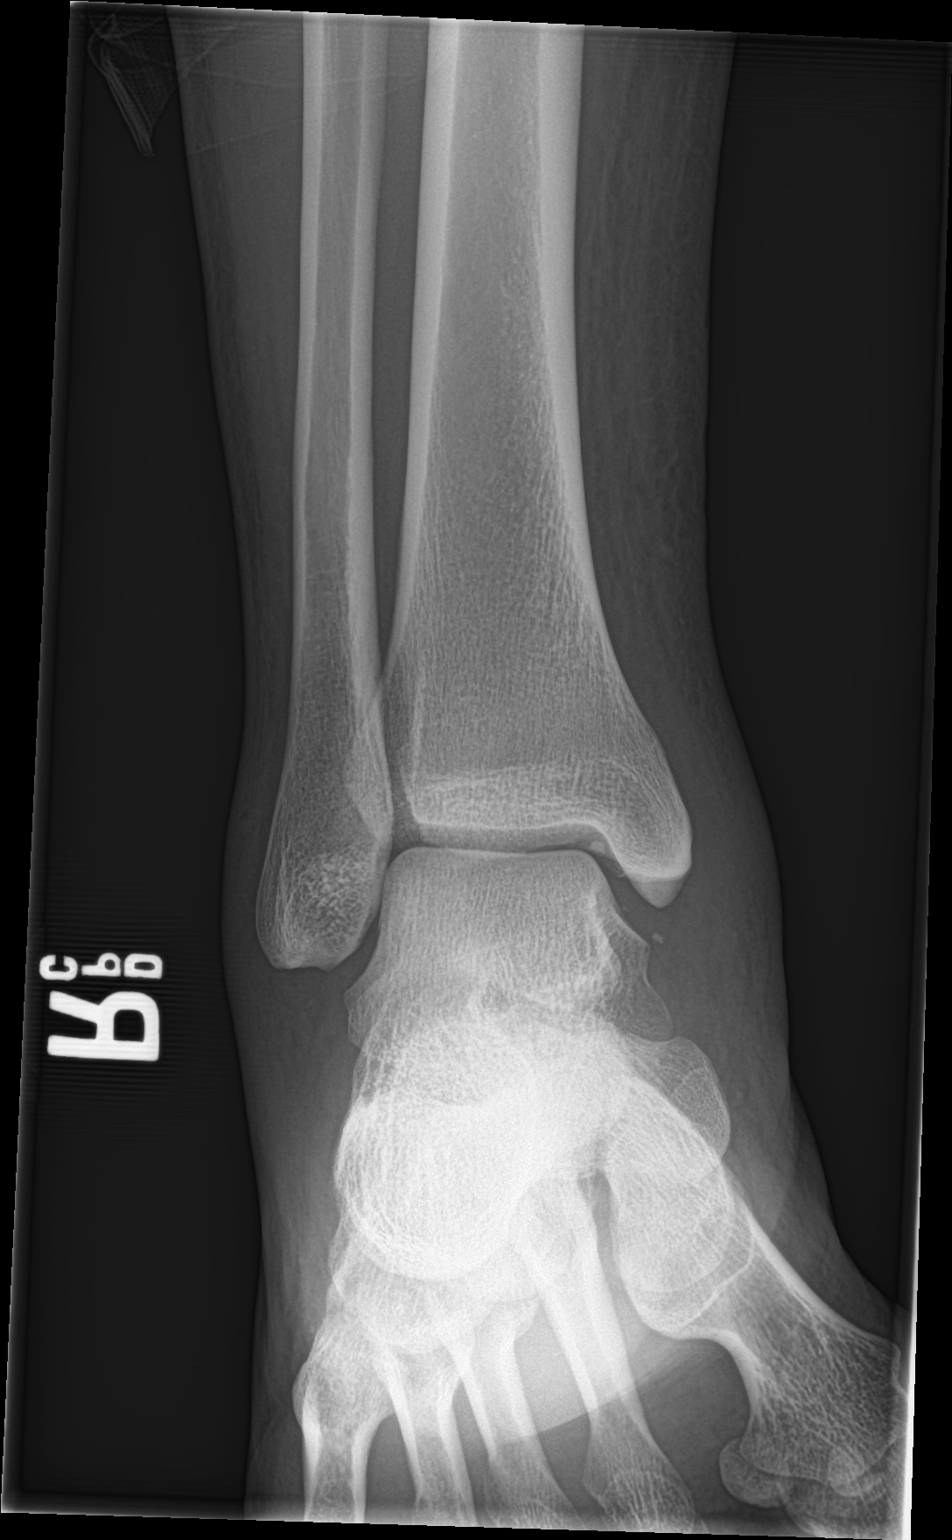

[ankle obl]
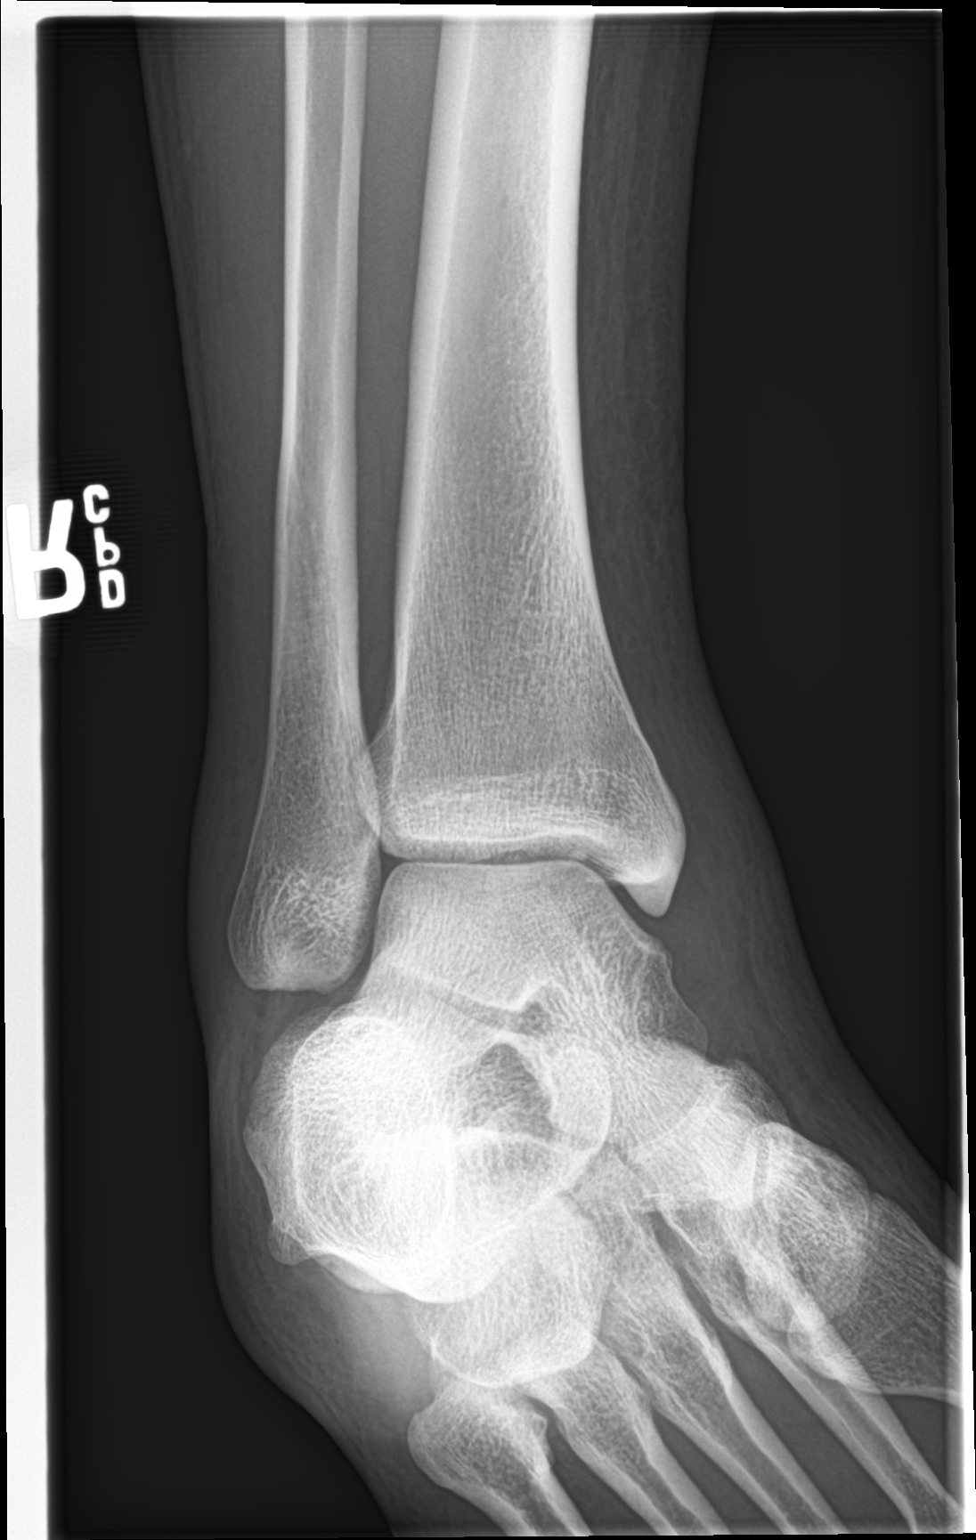

[ankle lat]
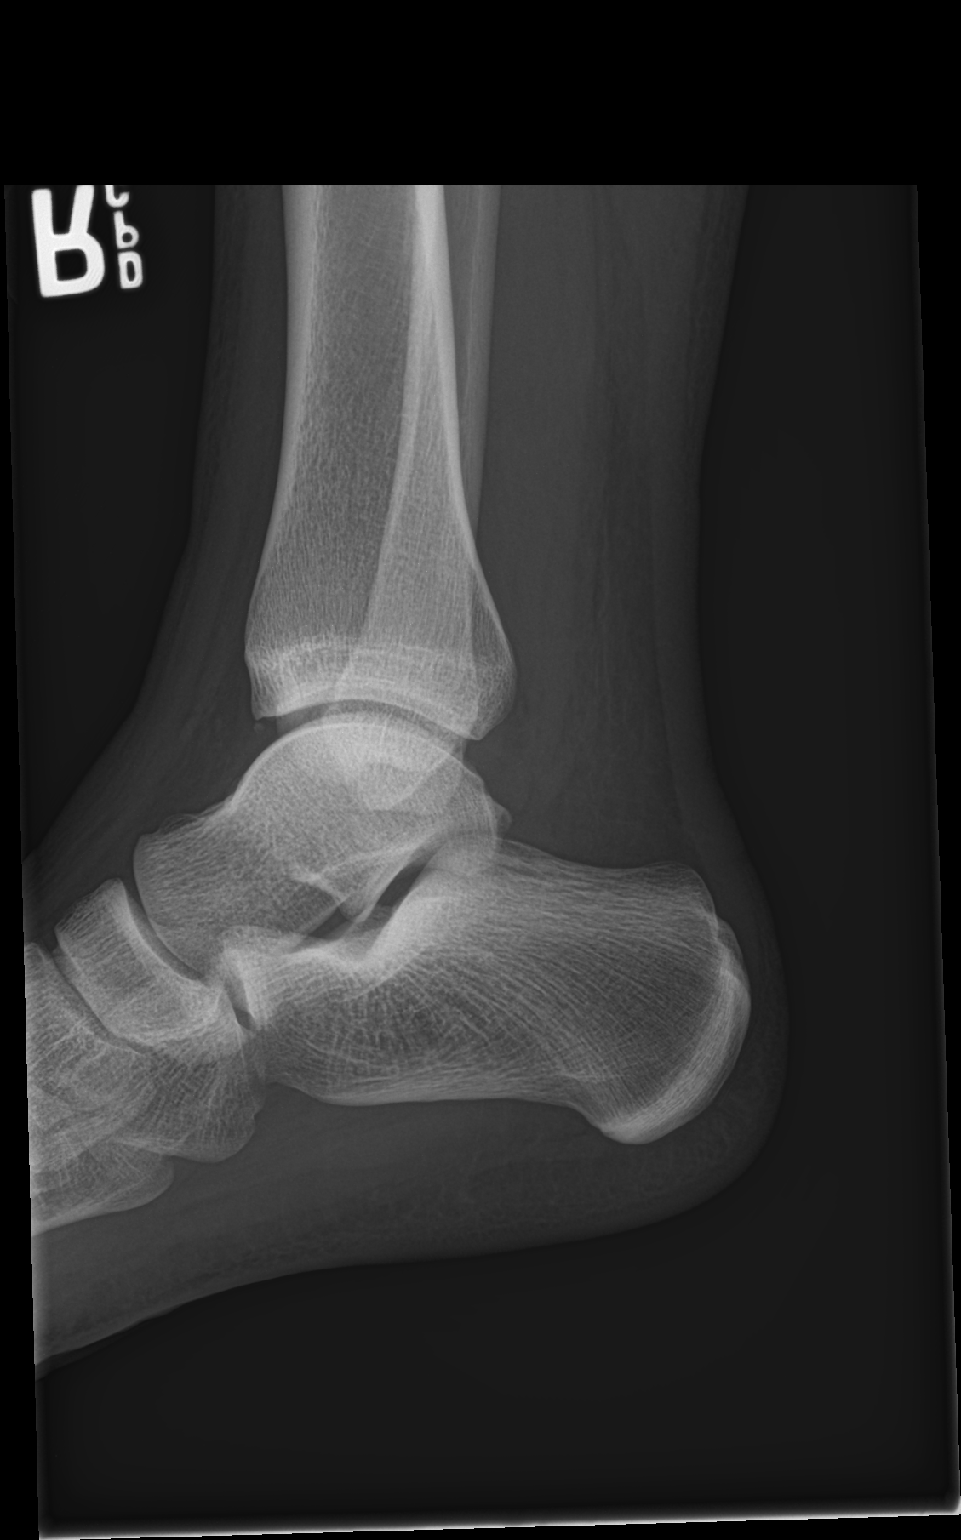

[3 of 3 positions shown; findings below may reference images not displayed]

FINDINGS: Soft tissue swelling greatest at medial aspect of ankle extending
into medial lower leg.

Osseous mineralization normal.

Joint spaces preserved.

Tiny bone fragments are identified at the medial joint line
compatible with tiny avulsion fragments.

Source of these fragments is uncertain, without definite donor site
visualized.

No additional fracture, dislocation, or bone destruction.
IMPRESSION: Several tiny avulsion fragments are identified at the medial RIGHT
ankle joint though the donor site for these fragments is not
definitely delineated.

No additional focal bony abnormalities identified.

## 2017-04-21 ENCOUNTER — Encounter: Payer: Self-pay | Admitting: Family Medicine

## 2017-04-21 ENCOUNTER — Ambulatory Visit (INDEPENDENT_AMBULATORY_CARE_PROVIDER_SITE_OTHER): Payer: 59 | Admitting: Family Medicine

## 2017-04-21 VITALS — BP 118/70 | Temp 99.0°F | Ht 70.0 in | Wt 185.0 lb

## 2017-04-21 DIAGNOSIS — I889 Nonspecific lymphadenitis, unspecified: Secondary | ICD-10-CM | POA: Diagnosis not present

## 2017-04-21 MED ORDER — AMOXICILLIN 500 MG PO TABS: 500.0000 mg | ORAL_TABLET | Freq: Three times a day (TID) | ORAL | 0 refills | Status: AC

## 2017-04-21 NOTE — Progress Notes (Signed)
   Subjective:    Patient ID: Johnathan Ford, male    DOB: 12/31/1995, 22 y.o.   MRN: [id number]  HPIleft side jaw pain and swelling. Started 3 days ago. Tried motrin.   Significant pain discomfort along the proximal jawline side of his neck denies ear pain does state he saw a dentist recently who told him he had wisdom teeth that needed to be cut out denies fever chills cough wheezing vomiting  Review of Systems Denies headache ear pain denies difficulty swallowing relates soreness on the left side of his neck denies chest tightness pressure pain coughing    Objective:   Physical Exam Eardrums are normal throat is normal he has ingrown wisdom teeth that are partially exposed no pus draining from this he has tenderness in the cervical lymph nodes on the left side along with some swelling around the angle of the jaw no swelling of the glands.       Assessment & Plan:  I doubt that he has a blocked salivary gland This appears to be cervical lymphadenitis Possibly related to his wisdom teeth Antibiotics prescribed warning signs discussed if further trouble follow-up.

## 2017-04-21 NOTE — Patient Instructions (Signed)
If ongoing issues please call

## 2017-07-18 MED FILL — HYDROCODON-APAP 5-325: 5-325 | 3 days supply | Qty: 12 | Fill #0

## 2017-07-18 MED FILL — AMOXICILLIN 875 MG TABLET: 875 | 7 days supply | Qty: 14 | Fill #0

## 2019-03-05 DIAGNOSIS — Z20822 Contact with and (suspected) exposure to covid-19: Secondary | ICD-10-CM | POA: Diagnosis not present

## 2019-04-01 ENCOUNTER — Other Ambulatory Visit: Payer: Self-pay

## 2019-04-01 ENCOUNTER — Other Ambulatory Visit (INDEPENDENT_AMBULATORY_CARE_PROVIDER_SITE_OTHER): Payer: 59

## 2019-04-01 DIAGNOSIS — Z23 Encounter for immunization: Secondary | ICD-10-CM

## 2020-01-04 DIAGNOSIS — R519 Headache, unspecified: Secondary | ICD-10-CM | POA: Diagnosis not present

## 2020-01-04 DIAGNOSIS — R110 Nausea: Secondary | ICD-10-CM | POA: Diagnosis not present

## 2020-01-04 DIAGNOSIS — R059 Cough, unspecified: Secondary | ICD-10-CM | POA: Diagnosis not present

## 2020-01-28 DIAGNOSIS — Z1152 Encounter for screening for COVID-19: Secondary | ICD-10-CM | POA: Diagnosis not present

## 2020-08-15 ENCOUNTER — Other Ambulatory Visit (HOSPITAL_COMMUNITY): Payer: Self-pay

## 2020-08-15 MED ORDER — CARESTART COVID-19 HOME TEST VI KIT
PACK | 0 refills | Status: AC
  Filled 2020-08-15: qty 4, 4d supply, fill #0

## 2020-08-16 ENCOUNTER — Other Ambulatory Visit (HOSPITAL_COMMUNITY): Payer: Self-pay
